# Patient Record
Sex: Female | Born: 1940 | Race: White | Hispanic: No | Marital: Married | Smoking: Never smoker
Health system: Southern US, Community
[De-identification: ages and names within clinical notes are randomized; demographics above are authoritative.]

## PROBLEM LIST (undated history)

## (undated) DIAGNOSIS — M1120 Other chondrocalcinosis, unspecified site: Secondary | ICD-10-CM

## (undated) DIAGNOSIS — M810 Age-related osteoporosis without current pathological fracture: Secondary | ICD-10-CM

## (undated) DIAGNOSIS — K279 Peptic ulcer, site unspecified, unspecified as acute or chronic, without hemorrhage or perforation: Secondary | ICD-10-CM

## (undated) DIAGNOSIS — C801 Malignant (primary) neoplasm, unspecified: Secondary | ICD-10-CM

## (undated) DIAGNOSIS — M5136 Other intervertebral disc degeneration, lumbar region: Secondary | ICD-10-CM

## (undated) DIAGNOSIS — E7800 Pure hypercholesterolemia, unspecified: Secondary | ICD-10-CM

## (undated) DIAGNOSIS — N390 Urinary tract infection, site not specified: Secondary | ICD-10-CM

## (undated) DIAGNOSIS — K219 Gastro-esophageal reflux disease without esophagitis: Secondary | ICD-10-CM

## (undated) DIAGNOSIS — R7989 Other specified abnormal findings of blood chemistry: Secondary | ICD-10-CM

## (undated) DIAGNOSIS — M51369 Other intervertebral disc degeneration, lumbar region without mention of lumbar back pain or lower extremity pain: Secondary | ICD-10-CM

## (undated) DIAGNOSIS — I10 Essential (primary) hypertension: Secondary | ICD-10-CM

## (undated) DIAGNOSIS — K222 Esophageal obstruction: Secondary | ICD-10-CM

## (undated) DIAGNOSIS — N183 Chronic kidney disease, stage 3 unspecified: Secondary | ICD-10-CM

## (undated) DIAGNOSIS — M47816 Spondylosis without myelopathy or radiculopathy, lumbar region: Secondary | ICD-10-CM

## (undated) DIAGNOSIS — M1990 Unspecified osteoarthritis, unspecified site: Secondary | ICD-10-CM

## (undated) HISTORY — PX: UPPER GI ENDOSCOPY: SHX6162

## (undated) HISTORY — PX: ROTATOR CUFF REPAIR: SHX139

## (undated) HISTORY — PX: BASAL CELL CARCINOMA EXCISION: SHX1214

## (undated) HISTORY — PX: KNEE CARTILAGE SURGERY: SHX688

## (undated) HISTORY — PX: COLONOSCOPY: SHX174

## (undated) HISTORY — PX: CERVICAL LAMINECTOMY: SHX94

## (undated) HISTORY — PX: TONSILLECTOMY: SUR1361

## (undated) HISTORY — PX: CARPAL TUNNEL RELEASE: SHX101

## (undated) HISTORY — PX: TOTAL VAGINAL HYSTERECTOMY: SHX2548

## (undated) HISTORY — PX: APPENDECTOMY: SHX54

---

## 2005-01-12 ENCOUNTER — Ambulatory Visit: Payer: Self-pay | Admitting: Obstetrics and Gynecology

## 2006-01-13 ENCOUNTER — Ambulatory Visit: Payer: Self-pay | Admitting: Obstetrics and Gynecology

## 2007-01-17 ENCOUNTER — Ambulatory Visit: Payer: Self-pay | Admitting: Internal Medicine

## 2008-01-19 ENCOUNTER — Ambulatory Visit (HOSPITAL_BASED_OUTPATIENT_CLINIC_OR_DEPARTMENT_OTHER): Admission: RE | Admit: 2008-01-19 | Discharge: 2008-01-19 | Payer: Self-pay | Admitting: Orthopedic Surgery

## 2008-04-04 ENCOUNTER — Ambulatory Visit: Payer: Self-pay | Admitting: Obstetrics and Gynecology

## 2009-02-13 ENCOUNTER — Ambulatory Visit (HOSPITAL_BASED_OUTPATIENT_CLINIC_OR_DEPARTMENT_OTHER): Admission: RE | Admit: 2009-02-13 | Discharge: 2009-02-14 | Payer: Self-pay | Admitting: Orthopedic Surgery

## 2009-08-31 ENCOUNTER — Emergency Department: Payer: Self-pay | Admitting: Emergency Medicine

## 2009-12-30 ENCOUNTER — Ambulatory Visit: Payer: Self-pay | Admitting: Obstetrics and Gynecology

## 2010-06-09 LAB — BASIC METABOLIC PANEL
CO2: 28 mEq/L (ref 19–32)
Chloride: 109 mEq/L (ref 96–112)
Creatinine, Ser: 1.06 mg/dL (ref 0.4–1.2)
GFR calc Af Amer: >60 mL/min (ref >60)

## 2010-07-21 NOTE — Op Note (Signed)
Meghan [name], [name]             ACCOUNT NO.:  [id number]   MEDICAL RECORD NO.:  [id number]          PATIENT TYPE:  AMB   LOCATION:  DSC                          FACILITY:  [facility]   PHYSICIAN:  Katy Fitch. Sypher, M.D. DATE OF BIRTH:  April 10, 1940   DATE OF PROCEDURE:  01/19/2008  DATE OF DISCHARGE:                               OPERATIVE REPORT   PREOPERATIVE DIAGNOSES:  1. Chronic entrapment neuropathy, right median nerve with carpal      tunnel.  2. History of adhesive capsulitis of right shoulder, improving      following steroid injection and therapy.   POSTOPERATIVE DIAGNOSES:  Minimal signs of residual adhesive capsulitis  and right carpal tunnel syndrome.   OPERATION:  1. Release of right transcarpal ligament.  2. Examination of right shoulder under general anesthesia, revealing      combined elevation 180 degrees; external rotation at 90 degrees,      abduction of 95 degrees; internal rotation at 90 degrees, abduction      of 80 degrees; essentially full range of motion.   OPERATIONS:  Katy Fitch. Sypher, MD   ASSISTANT:  Marveen Reeks Dasnoit, PA   ANESTHESIA:  General by LMA.   SUPERVISING ANESTHESIOLOGIST:  Janetta Hora. Gelene Mink, MD   INDICATIONS:  Meghan [name] is a 70 year old woman who was self-  referred for evaluation of hand numbness and shoulder pain.  Clinical  examination revealed signs of carpal tunnel syndrome.  She has been  managed with nonoperative measures including splinting, activity  modification, anti-inflammatory medication, and injection.   Due to a persistence of her hand numbness, she now presents for release  of her right transcarpal ligament.  In addition to her hand numbness,  she has had a history of shoulder discomfort, loss of motion, and pain.  She was treated with a steroid injection into her shoulder and  objectively increased her range of motion.  She reported persistent  shoulder pain.  We advised that we would examine her shoulder  under  anesthesia while performing her carpal tunnel release so as to determine  whether or not she had residual adhesive capsulitis.   There may be another explanation for her shoulder pain, such as  degenerative arthritis or tendinopathy.   After informed consent, she was brought to the operating room at this  time anticipating right carpal tunnel release through a very limited  incision with careful inspection of the contents of the carpal canal  including the median nerve, flexor tendons, and ulnar bursa and  examination of the right shoulder under anesthesia.   PROCEDURE:  Meghan [name] was brought to the operating room and placed  in supine position upon the operating table.   Following the induction of general anesthesia, we carefully examined Ms.  [name]' shoulder.  Lying in the supine position, I could combined  elevate the arm to 180 degrees and noted at 90 degrees abduction  external rotation of 95 degrees.  With the scapula stabilized, internal  rotation of 80 degrees was documented.   Given these findings and clinical examination, it would appear on an  objective basis that  her adhesive capsulitis symptoms had resolved.  There is likely an alternative explanation for her persistent shoulder  discomfort including possible degenerative arthritis or perhaps  tendinopathy.   We will continue to proceed down the diagnostic pathway to sort out her  shoulder discomfort issues.  To date, she has had not had an MRI.   The right arm was prepped with Betadine soap solution and sterilely  draped.  A pneumatic tourniquet was applied to the proximal right  brachium.  Upon exsanguination of the right arm with an Esmarch bandage,  the arterial tourniquet was inflated to 250 mmHg due to mild systolic  hypertension noted at induction.  The procedure commenced with a 0.5-  inch incision in the line of the ring finger and the palm.  Subcutaneous  tissues were carefully divided taking  care to identify the palmar  fascia.  The fascia was split longitudinally.  Traversing vessels were  electrocauterized with bipolar forceps.  A [place] 4 elevator was used  to sound the carpal canal.  The ulnar bursa was cleared from the deep  surface of the transcarpal ligament along its ulnar border followed by  release with the transcarpal ligament scissors extending into the distal  forearm.   This widely opened the carpal canal.  No masses or other predicaments  were noted.   Bleeding points along the margin of the released ligament were  electrocauterized with bipolar current followed by repair of the skin  with intradermal 3-0 Prolene suture.   A compressive dressing was applied with a volar plaster splint  maintaining the wrist in 5 degrees of dorsiflexion.   There were no apparent complications.   Meghan [name]' tolerated the surgery and anesthesia well.  She was  transferred to the recovery room with stable signs.      Katy Fitch Sypher, M.D.  Electronically Signed     RVS/MEDQ  D:  01/19/2008  T:  01/19/2008  Job:  [id number]

## 2010-12-08 LAB — BASIC METABOLIC PANEL
CO2: 31
Chloride: 105
Creatinine, Ser: 1.03
GFR calc Af Amer: >60
Sodium: 140

## 2011-01-15 ENCOUNTER — Ambulatory Visit: Payer: Self-pay | Admitting: Obstetrics and Gynecology

## 2011-08-19 ENCOUNTER — Other Ambulatory Visit: Payer: Self-pay | Admitting: Podiatry

## 2011-08-19 LAB — SYNOVIAL CELL COUNT + DIFF, W/ CRYSTALS
Basophil: 0 %
Eosinophil: 0 %
Lymphocytes: 0 %
Neutrophils: 93 %
Nucleated Cell Count: 6090 /mm3
Other Cells BF: 0 %
Other Mononuclear Cells: 7 %

## 2012-02-28 ENCOUNTER — Ambulatory Visit: Payer: Self-pay | Admitting: Obstetrics and Gynecology

## 2013-03-30 ENCOUNTER — Ambulatory Visit: Payer: Self-pay | Admitting: Obstetrics and Gynecology

## 2013-06-26 ENCOUNTER — Other Ambulatory Visit: Payer: Self-pay | Admitting: Physical Medicine and Rehabilitation

## 2013-06-26 DIAGNOSIS — M545 Low back pain, unspecified: Secondary | ICD-10-CM

## 2013-06-26 DIAGNOSIS — M79605 Pain in left leg: Secondary | ICD-10-CM

## 2013-07-03 ENCOUNTER — Ambulatory Visit
Admission: RE | Admit: 2013-07-03 | Discharge: 2013-07-03 | Disposition: A | Discharge status: Home / Self Care | Payer: Medicare Other | Source: Ambulatory Visit | Attending: Physical Medicine and Rehabilitation | Admitting: Physical Medicine and Rehabilitation

## 2013-07-03 DIAGNOSIS — M545 Low back pain, unspecified: Secondary | ICD-10-CM

## 2013-07-03 DIAGNOSIS — M79605 Pain in left leg: Secondary | ICD-10-CM

## 2014-04-03 ENCOUNTER — Ambulatory Visit: Payer: Self-pay | Admitting: Internal Medicine

## 2017-03-11 ENCOUNTER — Other Ambulatory Visit: Payer: Self-pay | Admitting: Internal Medicine

## 2017-03-11 DIAGNOSIS — Z1231 Encounter for screening mammogram for malignant neoplasm of breast: Secondary | ICD-10-CM

## 2017-03-30 ENCOUNTER — Ambulatory Visit
Admission: RE | Admit: 2017-03-30 | Discharge: 2017-03-30 | Disposition: A | Discharge status: Home / Self Care | Payer: Medicare Other | Source: Ambulatory Visit | Attending: Internal Medicine | Admitting: Internal Medicine

## 2017-03-30 ENCOUNTER — Encounter (HOSPITAL_COMMUNITY): Payer: Self-pay

## 2017-03-30 DIAGNOSIS — Z1231 Encounter for screening mammogram for malignant neoplasm of breast: Secondary | ICD-10-CM | POA: Insufficient documentation

## 2017-03-30 HISTORY — DX: Malignant (primary) neoplasm, unspecified: C80.1

## 2018-04-17 ENCOUNTER — Other Ambulatory Visit: Payer: Self-pay | Admitting: Internal Medicine

## 2018-04-17 DIAGNOSIS — Z1231 Encounter for screening mammogram for malignant neoplasm of breast: Secondary | ICD-10-CM

## 2018-04-27 ENCOUNTER — Ambulatory Visit
Admission: RE | Admit: 2018-04-27 | Discharge: 2018-04-27 | Disposition: A | Discharge status: Home / Self Care | Payer: Medicare Other | Source: Ambulatory Visit | Attending: Internal Medicine | Admitting: Internal Medicine

## 2018-04-27 DIAGNOSIS — Z1231 Encounter for screening mammogram for malignant neoplasm of breast: Secondary | ICD-10-CM | POA: Diagnosis not present

## 2019-04-05 ENCOUNTER — Other Ambulatory Visit: Payer: Self-pay | Admitting: Internal Medicine

## 2019-04-05 DIAGNOSIS — Z1231 Encounter for screening mammogram for malignant neoplasm of breast: Secondary | ICD-10-CM

## 2019-05-04 ENCOUNTER — Ambulatory Visit
Admission: RE | Admit: 2019-05-04 | Discharge: 2019-05-04 | Disposition: A | Discharge status: Home / Self Care | Payer: Medicare Other | Source: Ambulatory Visit | Attending: Internal Medicine | Admitting: Internal Medicine

## 2019-05-04 DIAGNOSIS — Z1231 Encounter for screening mammogram for malignant neoplasm of breast: Secondary | ICD-10-CM | POA: Diagnosis present

## 2019-05-08 ENCOUNTER — Other Ambulatory Visit: Payer: Self-pay | Admitting: Internal Medicine

## 2019-05-08 DIAGNOSIS — R928 Other abnormal and inconclusive findings on diagnostic imaging of breast: Secondary | ICD-10-CM

## 2019-05-08 DIAGNOSIS — N6310 Unspecified lump in the right breast, unspecified quadrant: Secondary | ICD-10-CM

## 2019-05-16 ENCOUNTER — Ambulatory Visit
Admission: RE | Admit: 2019-05-16 | Discharge: 2019-05-16 | Disposition: A | Discharge status: Home / Self Care | Payer: Medicare Other | Source: Ambulatory Visit | Attending: Internal Medicine | Admitting: Internal Medicine

## 2019-05-16 DIAGNOSIS — R928 Other abnormal and inconclusive findings on diagnostic imaging of breast: Secondary | ICD-10-CM

## 2019-05-16 DIAGNOSIS — N6310 Unspecified lump in the right breast, unspecified quadrant: Secondary | ICD-10-CM | POA: Diagnosis present

## 2019-05-18 ENCOUNTER — Other Ambulatory Visit: Payer: Self-pay | Admitting: Internal Medicine

## 2019-05-18 DIAGNOSIS — N6310 Unspecified lump in the right breast, unspecified quadrant: Secondary | ICD-10-CM

## 2019-11-21 ENCOUNTER — Ambulatory Visit
Admission: RE | Admit: 2019-11-21 | Discharge: 2019-11-21 | Disposition: A | Discharge status: Home / Self Care | Payer: Medicare Other | Source: Ambulatory Visit | Attending: Internal Medicine | Admitting: Internal Medicine

## 2019-11-21 ENCOUNTER — Other Ambulatory Visit: Payer: Self-pay

## 2019-11-21 ENCOUNTER — Other Ambulatory Visit: Payer: Self-pay | Admitting: Internal Medicine

## 2019-11-21 DIAGNOSIS — N6310 Unspecified lump in the right breast, unspecified quadrant: Secondary | ICD-10-CM | POA: Diagnosis present

## 2019-11-21 DIAGNOSIS — R928 Other abnormal and inconclusive findings on diagnostic imaging of breast: Secondary | ICD-10-CM

## 2019-11-22 ENCOUNTER — Other Ambulatory Visit: Payer: Self-pay | Admitting: Internal Medicine

## 2019-11-22 DIAGNOSIS — N6310 Unspecified lump in the right breast, unspecified quadrant: Secondary | ICD-10-CM

## 2020-11-21 DIAGNOSIS — Z85828 Personal history of other malignant neoplasm of skin: Secondary | ICD-10-CM | POA: Diagnosis not present

## 2020-11-21 DIAGNOSIS — R519 Headache, unspecified: Secondary | ICD-10-CM | POA: Diagnosis not present

## 2020-11-21 DIAGNOSIS — R197 Diarrhea, unspecified: Secondary | ICD-10-CM | POA: Insufficient documentation

## 2020-11-21 DIAGNOSIS — R7309 Other abnormal glucose: Secondary | ICD-10-CM | POA: Diagnosis not present

## 2020-11-21 DIAGNOSIS — R42 Dizziness and giddiness: Secondary | ICD-10-CM | POA: Diagnosis present

## 2020-11-21 DIAGNOSIS — E860 Dehydration: Secondary | ICD-10-CM | POA: Diagnosis not present

## 2020-11-21 DIAGNOSIS — R112 Nausea with vomiting, unspecified: Secondary | ICD-10-CM | POA: Insufficient documentation

## 2020-11-21 LAB — COMPREHENSIVE METABOLIC PANEL
ALT: 20 U/L (ref 0–44)
AST: 26 U/L (ref 15–41)
Albumin: 4.2 g/dL (ref 3.5–5.0)
Alkaline Phosphatase: 58 U/L (ref 38–126)
Anion gap: 8 (ref 5–15)
BUN: 15 mg/dL (ref 8–23)
CO2: 27 mmol/L (ref 22–32)
Calcium: 8.8 mg/dL — ABNORMAL LOW (ref 8.9–10.3)
Chloride: 103 mmol/L (ref 98–111)
Creatinine, Ser: 0.9 mg/dL (ref 0.44–1.00)
GFR, Estimated: >60 mL/min (ref >60)
Glucose, Bld: 143 mg/dL — ABNORMAL HIGH (ref 70–99)
Potassium: 4.4 mmol/L (ref 3.5–5.1)
Sodium: 138 mmol/L (ref 135–145)
Total Bilirubin: 0.9 mg/dL (ref 0.3–1.2)
Total Protein: 6.9 g/dL (ref 6.5–8.1)

## 2020-11-21 LAB — CBG MONITORING, ED: Glucose-Capillary: 130 mg/dL — ABNORMAL HIGH (ref 70–99)

## 2020-11-21 LAB — CBC
HCT: 42.5 % (ref 36.0–46.0)
Hemoglobin: 14.3 g/dL (ref 12.0–15.0)
MCH: 30.9 pg (ref 26.0–34.0)
MCHC: 33.6 g/dL (ref 30.0–36.0)
MCV: 91.8 fL (ref 80.0–100.0)
Platelets: 197 10*3/uL (ref 150–400)
RBC: 4.63 MIL/uL (ref 3.87–5.11)
RDW: 12.3 % (ref 11.5–15.5)
WBC: 6.5 10*3/uL (ref 4.0–10.5)
nRBC: 0 % (ref 0.0–0.2)

## 2020-11-21 LAB — LIPASE, BLOOD: Lipase: 39 U/L (ref 11–51)

## 2020-11-21 NOTE — ED Triage Notes (Signed)
Pt c/o dizziness and diarrhea starting last night and emesis and upper abdominal pain starting this morning.  Pain score 5/10.  Pt reports she has not been able to keep anything down.  Sts "it feels like the world is spinning."  Facial symmetry and equal grips/strength noted.

## 2020-11-21 NOTE — ED Triage Notes (Signed)
Arrives from [facility] for evaluation of blurred vision and HTN.  Per [place] report, symptom onset last evening at around 1800.  Patient is AAOx3.  Skin warm and dry.  MAE equal and strong. NAD

## 2020-11-22 ENCOUNTER — Emergency Department: Payer: Medicare Other

## 2020-11-22 ENCOUNTER — Emergency Department
Admission: EM | Admit: 2020-11-22 | Discharge: 2020-11-22 | Disposition: A | Discharge status: Home / Self Care | Payer: Medicare Other | Attending: Emergency Medicine | Admitting: Emergency Medicine

## 2020-11-22 ENCOUNTER — Other Ambulatory Visit: Payer: Self-pay

## 2020-11-22 DIAGNOSIS — R112 Nausea with vomiting, unspecified: Secondary | ICD-10-CM

## 2020-11-22 DIAGNOSIS — E860 Dehydration: Secondary | ICD-10-CM | POA: Diagnosis not present

## 2020-11-22 DIAGNOSIS — R519 Headache, unspecified: Secondary | ICD-10-CM

## 2020-11-22 LAB — MAGNESIUM: Magnesium: 2.3 mg/dL (ref 1.7–2.4)

## 2020-11-22 LAB — URINALYSIS, COMPLETE (UACMP) WITH MICROSCOPIC
Bilirubin Urine: NEGATIVE
Glucose, UA: NEGATIVE mg/dL
Ketones, ur: 40 mg/dL — AB
Nitrite: NEGATIVE
Protein, ur: NEGATIVE mg/dL
Specific Gravity, Urine: 1.015 (ref 1.005–1.030)
Squamous Epithelial / HPF: NONE SEEN (ref 0–5)
pH: 7 (ref 5.0–8.0)

## 2020-11-22 MED ORDER — PROCHLORPERAZINE EDISYLATE 10 MG/2ML IJ SOLN
10.0000 mg | Freq: Once | INTRAMUSCULAR | Status: AC
  Administered 2020-11-22: 10 mg via INTRAVENOUS
  Filled 2020-11-22: qty 2

## 2020-11-22 MED ORDER — IOHEXOL 350 MG/ML SOLN
75.0000 mL | Freq: Once | INTRAVENOUS | Status: AC | PRN
  Administered 2020-11-22: 75 mL via INTRAVENOUS
  Filled 2020-11-22: qty 75

## 2020-11-22 MED ORDER — MECLIZINE HCL 25 MG PO CHEW: 1.0000 | CHEWABLE_TABLET | Freq: Two times a day (BID) | ORAL | 0 refills | Status: AC | PRN

## 2020-11-22 MED ORDER — LACTATED RINGERS IV BOLUS
1000.0000 mL | Freq: Once | INTRAVENOUS | Status: AC
  Administered 2020-11-22: 1000 mL via INTRAVENOUS

## 2020-11-22 NOTE — ED Provider Notes (Signed)
[facility] Emergency Department Provider Note  ____________________________________________   Event Date/Time   First MD Initiated Contact with Patient 11/22/20 1111     (approximate)  I have reviewed the triage vital signs and the nursing notes.   HISTORY  Chief Complaint Dizziness, Diarrhea, and Emesis   HPI Meghan [name] is a 80 y.o. female with a past medical history of hypertension and osteoporosis who presents for assessment of headache, vertigo, and some nonbloody nonbilious vomiting as well as some nonbloody diarrhea and crampy abdominal pain seems to have been particularly severe yesterday.  Patient states her abdominal pain is now gone and she has not had any vomiting today although she attributes this to her stomach being completely empty.  She states she still has some headache and feels a little bit dizzy.  She states the headache radiates towards the back of her head and down the right side of her neck.  She denies any recent trauma.  No photophobia or phonophobia.  No prior similar headaches.  She notes her blood pressures been very high and she initially gone to urgent care and was told the top number was over 200 change in emergency room.  She denies any vision changes, chest pain, cough, shortness of breath, back pain, urinary symptoms, rash or extremity pain.  No focal weakness numbness or tingling.  No history of stroke or cardiac history.  No recent travel or sick contacts.  No suspicious food intake.  No other acute concerns at this time.     Past Medical History:  Diagnosis Date   Cancer ([place])    skin    There are no problems to display for this patient.   History reviewed. No pertinent surgical history.  Prior to Admission medications   Medication Sig Start Date End Date Taking? Authorizing Provider  Meclizine HCl (ANTIVERT) 25 MG CHEW Chew 1 tablet (25 mg total) by mouth 2 (two) times daily as needed for up to 3 days. 11/22/20  11/25/20 Yes Lucrezia Starch, MD    Allergies Macrobid [nitrofurantoin], Percocet [oxycodone-acetaminophen], and Morphine and related  Family History  Problem Relation Age of Onset   Breast cancer Neg Hx     Social History Social History   Tobacco Use   Smoking status: Never   Smokeless tobacco: Never  Substance Use Topics   Alcohol use: Never   Drug use: Never    Review of Systems  Review of Systems  Constitutional:  Positive for malaise/fatigue. Negative for chills and fever.  HENT:  Negative for sore throat.   Eyes:  Negative for pain.  Respiratory:  Negative for cough and stridor.   Cardiovascular:  Negative for chest pain.  Gastrointestinal:  Positive for abdominal pain, diarrhea, nausea and vomiting.  Genitourinary:  Negative for dysuria.  Musculoskeletal:  Negative for myalgias.  Skin:  Negative for rash.  Neurological:  Positive for dizziness and headaches. Negative for seizures and loss of consciousness.  Psychiatric/Behavioral:  Negative for suicidal ideas.   All other systems reviewed and are negative.    ____________________________________________   PHYSICAL EXAM:  VITAL SIGNS: ED Triage Vitals  Enc Vitals Group     BP 11/21/20 1900 (!) 163/94     Pulse Rate 11/21/20 1900 69     Resp 11/21/20 1900 16     Temp 11/21/20 1900 98.2 F (36.8 C)     Temp Source 11/21/20 1900 Oral     SpO2 11/21/20 1900 100 %  Weight 11/21/20 1901 130 lb (59 kg)     Height 11/21/20 1901 '5\' 3"'$  (1.6 m)     Head Circumference --      Peak Flow --      Pain Score 11/21/20 1901 5     Pain Loc --      Pain Edu? --      Excl. in [place]? --    Vitals:   11/22/20 0834 11/22/20 1242  BP: (!) 198/71 (!) 146/70  Pulse: (!) 57 64  Resp: 18 16  Temp:    SpO2: 100% 99%   Physical Exam Vitals and nursing note reviewed.  Constitutional:      General: She is not in acute distress.    Appearance: She is well-developed.  HENT:     Head: Normocephalic and atraumatic.      Right Ear: External ear normal.     Left Ear: External ear normal.     Nose: Nose normal.     Mouth/Throat:     Mouth: Mucous membranes are dry.  Eyes:     Conjunctiva/sclera: Conjunctivae normal.  Cardiovascular:     Rate and Rhythm: Normal rate and regular rhythm.     Heart sounds: No murmur heard. Pulmonary:     Effort: Pulmonary effort is normal. No respiratory distress.     Breath sounds: Normal breath sounds.  Abdominal:     Palpations: Abdomen is soft.     Tenderness: There is no abdominal tenderness.  Musculoskeletal:     Cervical back: Neck supple.  Skin:    General: Skin is warm and dry.     Capillary Refill: Capillary refill takes more than 3 seconds.  Neurological:     Mental Status: She is alert and oriented to person, place, and time.    Cranial nerves II through XII grossly intact.  No pronator drift.  No finger dysmetria.  Symmetric 5/5 strength of all extremities.  Sensation intact to light touch in all extremities.  Unremarkable unassisted gait.  ____________________________________________   LABS (all labs ordered are listed, but only abnormal results are displayed)  Labs Reviewed  COMPREHENSIVE METABOLIC PANEL - Abnormal; Notable for the following components:      Result Value   Glucose, Bld 143 (*)    Calcium 8.8 (*)    All other components within normal limits  URINALYSIS, COMPLETE (UACMP) WITH MICROSCOPIC - Abnormal; Notable for the following components:   Hgb urine dipstick SMALL (*)    Ketones, ur 40 (*)    Leukocytes,Ua TRACE (*)    Bacteria, UA RARE (*)    All other components within normal limits  CBG MONITORING, ED - Abnormal; Notable for the following components:   Glucose-Capillary 130 (*)    All other components within normal limits  CBC  LIPASE, BLOOD  MAGNESIUM  CBG MONITORING, ED   ____________________________________________  EKG  ECG shows sinus rhythm with ventricular rate of 63, normal axis, unremarkable intervals without  evidence of acute ischemia or significant arrhythmia. ____________________________________________  RADIOLOGY  ED MD interpretation: CT without contrast of the head shows no evidence of SAH or acute intracranial pathology.  There is evidence of cerebral atrophy.  CTA head and neck shows no evidence of significant stenosis, occlusion dissection or aneurysm.  No evidence of myositis or subcutaneous abscess.  Official radiology report(s): CT ANGIO HEAD NECK W WO CM  Result Date: 11/22/2020 CLINICAL DATA:  Dizziness, blurred vision EXAM: CT ANGIOGRAPHY HEAD AND NECK TECHNIQUE: Multidetector CT imaging of the  head and neck was performed using the standard protocol during bolus administration of intravenous contrast. Multiplanar CT image reconstructions and MIPs were obtained to evaluate the vascular anatomy. Carotid stenosis measurements (when applicable) are obtained utilizing NASCET criteria, using the distal internal carotid diameter as the denominator. CONTRAST:  60m OMNIPAQUE IOHEXOL 350 MG/ML SOLN COMPARISON:  Same-day noncontrast CT head FINDINGS: CTA NECK FINDINGS Aortic arch: There is mild calcified atherosclerotic plaque of the aortic arch. The aortic arch is otherwise unremarkable. Right carotid system: No evidence of dissection, stenosis (50% or greater) or occlusion. Left carotid system: No evidence of dissection, stenosis (50% or greater) or occlusion. Vertebral arteries: There is mild calcified atherosclerotic plaque at the origin of the right vertebral artery without hemodynamically significant stenosis or occlusion. The vertebral arteries are otherwise patent, without significant stenosis, occlusion, or dissection. Skeleton: There is multilevel degenerative change of the cervical spine, most advanced at C4-C5 through C6-C7. There is grade 1 anterolisthesis of C3 on C4. There is no acute osseous abnormality or aggressive osseous lesion. Other neck: The thyroid is mildly heterogeneous with no  suspicious nodules. The soft tissues are otherwise unremarkable. Upper chest: There is mild central bronchial wall thickening. There is mild biapical parenchymal scarring. Review of the MIP images confirms the above findings CTA HEAD FINDINGS Anterior circulation: There is mild calcification of the bilateral cavernous ICAs without hemodynamically significant stenosis or occlusion. The bilateral MCAs and ACAs are patent. There is no aneurysm. Posterior circulation: The V4 segments of the vertebral arteries are patent. The basilar artery is patent. The bilateral PCAs are patent. The posterior communicating arteries are identified bilaterally. Venous sinuses: Patent. Anatomic variants: None. Review of the MIP images confirms the above findings IMPRESSION: Patent vasculature of the head and neck with no significant stenosis, occlusion, dissection, or aneurysm. Electronically Signed   By: PValetta MoleM.D.   On: 11/22/2020 14:05   CT HEAD WO CONTRAST (5MM)  Result Date: 11/22/2020 CLINICAL DATA:  Dizziness. EXAM: CT HEAD WITHOUT CONTRAST TECHNIQUE: Contiguous axial images were obtained from the base of the skull through the vertex without intravenous contrast. COMPARISON:  None. FINDINGS: Brain: There is mild cerebral atrophy with widening of the extra-axial spaces and ventricular dilatation. There are areas of decreased attenuation within the white matter tracts of the supratentorial brain, consistent with microvascular disease changes. Vascular: No hyperdense vessel or unexpected calcification. Skull: Normal. Negative for fracture or focal lesion. Sinuses/Orbits: No acute finding. Other: None. IMPRESSION: 1. Generalized cerebral atrophy. 2. No acute intracranial abnormality. Electronically Signed   By: TVirgina NorfolkM.D.   On: 11/22/2020 04:21    ____________________________________________   PROCEDURES  Procedure(s) performed (including Critical  Care):  Procedures   ____________________________________________   INITIAL IMPRESSION / ASSESSMENT AND PLAN / ED COURSE      Patient presents with above-stated history exam for assessment of some nausea, vomiting and abdominal pain that has since resolved associate with headache and some dizziness described as vertigo.  On arrival patient is hypertensive with BP of 163/94.  She states is not any significant vomiting or diarrhea today.  She is feeling empty.  She states her abdominal pain is resolved.  She has a nonfocal neuro exam.  I suspect likely some dehydration secondary to acute infectious gastroenteritis likely causing blood pressure to be elevated as patient has been unable to tolerate any of her metoprolol over the last 24 hours .  There were no focal deficits or ataxia on exam to suggest central cause for  her vertigo.  CT head shows no evidence of intracranial hemorrhage or other clear acute intracranial process.  Given she did describes a throbbing onset of her neck CTA obtained to rule out dissection is unremarkable.  Absence of any chest pain and reassuring EKG is not suggestive of atypical presentation for ACS.   CMP shows no significant electrolyte or metabolic derangements.  CBC shows no leukocytosis or acute anemia.  Lipase not consistent with acute pancreatitis.  Magnesium is within normal limits.  UA has some rare bacteria but no elevation of WBCs, or nitrites and only trace leukocyte esterase and given absence of any complaints of dysuria I have a low suspicion for clinically significant cystitis at this time.  Patient given some IV fluids for dehydration and Compazine for headache and nausea.  On reassessment she was able to tolerate some ginger ale and eat some crackers.  She states she is feeling better.  Her blood pressure briefly up trended to 190s although is now back down to 146/70 at 2 PM.  Will defer further antihypertensives at this time.  She states her headache and  dizziness have both much improved.  She was able to ambulate with steady gait unassisted to the bathroom and back.  Given improvement in symptoms with stable vitals and reassuring exam and head imaging and labs I think she is stable for discharge with close outpatient follow-up.  Advised her she may resume her metoprolol.  Will prescribe short course of Antivert for what I suspect is likely peripheral vertigo.  Discharged stable condition.  Strict return precautions advised and discussed.          ____________________________________________   FINAL CLINICAL IMPRESSION(S) / ED DIAGNOSES  Final diagnoses:  Dehydration  Nonintractable headache, unspecified chronicity pattern, unspecified headache type  Nausea vomiting and diarrhea    Medications  lactated ringers bolus 1,000 mL (1,000 mLs Intravenous New Bag/Given 11/22/20 1405)  prochlorperazine (COMPAZINE) injection 10 mg (10 mg Intravenous Given 11/22/20 1405)  iohexol (OMNIPAQUE) 350 MG/ML injection 75 mL (75 mLs Intravenous Contrast Given 11/22/20 1330)     ED Discharge Orders          Ordered    Meclizine HCl (ANTIVERT) 25 MG CHEW  2 times daily PRN        11/22/20 1518             Note:  This document was prepared using Dragon voice recognition software and may include unintentional dictation errors.    Lucrezia Starch, MD 11/22/20 [phone]

## 2021-04-22 ENCOUNTER — Other Ambulatory Visit: Payer: Self-pay | Admitting: Internal Medicine

## 2021-04-22 DIAGNOSIS — Z1231 Encounter for screening mammogram for malignant neoplasm of breast: Secondary | ICD-10-CM

## 2021-05-27 ENCOUNTER — Other Ambulatory Visit: Payer: Self-pay

## 2021-05-27 ENCOUNTER — Ambulatory Visit
Admission: RE | Admit: 2021-05-27 | Discharge: 2021-05-27 | Disposition: A | Discharge status: Home / Self Care | Payer: Medicare Other | Source: Ambulatory Visit | Attending: Internal Medicine | Admitting: Internal Medicine

## 2021-05-27 DIAGNOSIS — Z1231 Encounter for screening mammogram for malignant neoplasm of breast: Secondary | ICD-10-CM | POA: Insufficient documentation

## 2021-11-18 ENCOUNTER — Other Ambulatory Visit: Payer: Self-pay | Admitting: Orthopedic Surgery

## 2021-11-18 DIAGNOSIS — Z01818 Encounter for other preprocedural examination: Secondary | ICD-10-CM

## 2021-11-23 ENCOUNTER — Ambulatory Visit
Admission: RE | Admit: 2021-11-23 | Discharge: 2021-11-23 | Disposition: A | Discharge status: Home / Self Care | Payer: Medicare Other | Source: Ambulatory Visit | Attending: Orthopedic Surgery | Admitting: Orthopedic Surgery

## 2021-11-23 DIAGNOSIS — Z01818 Encounter for other preprocedural examination: Secondary | ICD-10-CM

## 2021-12-16 NOTE — Progress Notes (Signed)
Sent message, via epic in basket, requesting orders in epic from surgeon.  

## 2021-12-22 ENCOUNTER — Encounter (HOSPITAL_COMMUNITY): Payer: Self-pay | Admitting: Orthopedic Surgery

## 2021-12-22 NOTE — Progress Notes (Signed)
For Short Stay: [place] appointment date: Date of COVID positive in last 52 days:  Bowel Prep reminder:   For Anesthesia: PCP - Kirk Ruths, MD last office visit note 07/29/21 Cardiologist -   Chest x-ray -  EKG -  Stress Test -  ECHO -  Cardiac Cath -  Pacemaker/ICD device last checked: Pacemaker orders received: Device Rep notified:  Spinal Cord Stimulator:  Sleep Study -  CPAP -   Fasting Blood Sugar -  Checks Blood Sugar _____ times a day Date and result of last Hgb A1c-  Blood Thinner Instructions: Aspirin Instructions: Last Dose:  Activity level: Can go up a flight of stairs and activities of daily living without stopping and without chest pain and/or shortness of breath   Able to exercise without chest pain and/or shortness of breath   Unable to go up a flight of stairs without chest pain and/or shortness of breath     Anesthesia review:   Patient denies shortness of breath, fever, cough and chest pain at PAT appointment   Patient verbalized understanding of instructions that were given to them at the PAT appointment. Patient was also instructed that they will need to review over the PAT instructions again at home before surgery.

## 2021-12-22 NOTE — Patient Instructions (Addendum)
SURGICAL WAITING ROOM VISITATION Patients having surgery or a procedure may have no more than 2 support people in the waiting area - these visitors may rotate.   Children under the age of 41 must have an adult with them who is not the patient. If the patient needs to stay at the hospital during part of their recovery, the visitor guidelines for inpatient rooms apply. Pre-op nurse will coordinate an appropriate time for 1 support person to accompany patient in pre-op.  This support person may not rotate.    Please refer to the [facility] website for the visitor guidelines for Inpatients (after your surgery is over and you are in a regular room).       Your procedure is scheduled on: Tuesday, Oct. 31, 2023   Report to [facility] Main Entrance    Report to admitting at 5:30 AM   Call this number if you have problems the morning of surgery [phone]   Do not eat food :After Midnight.   After Midnight you may have the following liquids until 4:30  AM DAY OF SURGERY  Water Non-Citrus Juices (without pulp, NO RED) Carbonated Beverages Black Coffee (NO MILK/CREAM OR CREAMERS, sugar ok)  Clear Tea (NO MILK/CREAM OR CREAMERS, sugar ok) regular and decaf                             Plain Jell-O (NO RED)                                           Fruit ices (not with fruit pulp, NO RED)                                     Popsicles (NO RED)                                                               Sports drinks like Gatorade (NO RED)              Drink 2 Ensure/G2 drinks AT 10:00 PM the night before surgery.        The day of surgery:  Drink ONE (1) Pre-Surgery Clear Ensure at 4:30 AM the morning of surgery. Drink in one sitting. Do not sip.  This drink was given to you during your hospital  pre-op appointment visit. Nothing else to drink after completing the  Pre-Surgery Clear Ensure.          If you have questions, please contact your surgeon's office.   FOLLOW  BOWEL PREP AND ANY ADDITIONAL PRE OP INSTRUCTIONS YOU RECEIVED FROM YOUR SURGEON'S OFFICE!!!     Oral Hygiene is also important to reduce your risk of infection.                                    Remember - BRUSH YOUR TEETH THE MORNING OF SURGERY WITH YOUR REGULAR TOOTHPASTE   Do NOT smoke after Midnight   Take these medicines  the morning of surgery with A SIP OF WATER: Carvedilol, Colchicine                              You may not have any metal on your body including hair pins, jewelry, and body piercing             Do not wear make-up, lotions, powders, perfumes/cologne, or deodorant  Do not wear nail polish including gel and S&S, artificial/acrylic nails, or any other type of covering on natural nails including finger and toenails. If you have artificial nails, gel coating, etc. that needs to be removed by a nail salon please have this removed prior to surgery or surgery may need to be canceled/ delayed if the surgeon/ anesthesia feels like they are unable to be safely monitored.   Do not shave  48 hours prior to surgery.    Do not bring valuables to the hospital. [place].   Contacts, dentures or bridgework may not be worn into surgery.  DO NOT [place]. PHARMACY WILL DISPENSE MEDICATIONS LISTED ON YOUR MEDICATION LIST TO YOU DURING YOUR ADMISSION [place]!    Patients discharged on the day of surgery will not be allowed to drive home.  Someone NEEDS to stay with you for the first 24 hours after anesthesia.   Special Instructions: Bring a copy of your healthcare power of attorney and living will documents the day of surgery if you haven't scanned them before.              Please read over the following fact sheets you were given: IF [place] [phone]   If you received a COVID test during your pre-op visit  it is requested that you  wear a mask when out in public, stay away from anyone that may not be feeling well and notify your surgeon if you develop symptoms. If you test positive for Covid or have been in contact with anyone that has tested positive in the last 10 days please notify you surgeon.     [place]- Preparing for Total Shoulder Arthroplasty    Before surgery, you can play an important role. Because skin is not sterile, your skin needs to be as free of germs as possible. You can reduce the number of germs on your skin by using the following products. Benzoyl Peroxide Gel Reduces the number of germs present on the skin Applied twice a day to shoulder area starting two days before surgery    ==================================================================  Please follow these instructions carefully:  BENZOYL PEROXIDE 5% GEL  Please do not use if you have an allergy to benzoyl peroxide.   If your skin becomes reddened/irritated stop using the benzoyl peroxide.  Starting two days before surgery, apply as follows:  Sunday, Oct. 29, 2023 and Monday, Oct. 30, 2023 Apply benzoyl peroxide in the morning and at night. Apply after taking a shower. If you are not taking a shower clean entire shoulder front, back, and side along with the armpit with a clean wet washcloth.  Place a quarter-sized dollop on your shoulder and rub in thoroughly, making sure to cover the front, back, and side of your shoulder, along with the armpit.   2 days before ____ AM  ____ PM              1 day before ____ AM   ____ PM                         Do this twice a day for two days.  (Last application is the night before surgery, AFTER using the CHG soap as described below).  Do NOT apply benzoyl peroxide gel on the day of surgery.   [place] - Preparing for Surgery Before surgery, you can play an important role.  Because skin is not sterile, your skin needs to be as free of germs as possible.  You can reduce the number of germs on  your skin by washing with CHG (chlorahexidine gluconate) soap before surgery.  CHG is an antiseptic cleaner which kills germs and bonds with the skin to continue killing germs even after washing. Please DO NOT use if you have an allergy to CHG or antibacterial soaps.  If your skin becomes reddened/irritated stop using the CHG and inform your nurse when you arrive at Short Stay. Do not shave (including legs and underarms) for at least 48 hours prior to the first CHG shower.  You may shave your face/neck.  Please follow these instructions carefully:  1.  Shower with CHG Soap the night before surgery and the  morning of surgery.  2.  If you choose to wash your hair, wash your hair first as usual with your normal  shampoo.  3.  After you shampoo, rinse your hair and body thoroughly to remove the shampoo.                             4.  Use CHG as you would any other liquid soap.  You can apply chg directly to the skin and wash.  Gently with a scrungie or clean washcloth.  5.  Apply the CHG Soap to your body ONLY FROM THE NECK DOWN.   Do   not use on face/ open                           Wound or open sores. Avoid contact with eyes, ears mouth and   genitals (private parts).                       Wash face,  Genitals (private parts) with your normal soap.             6.  Wash thoroughly, paying special attention to the area where your    surgery  will be performed.  7.  Thoroughly rinse your body with warm water from the neck down.  8.  DO NOT shower/wash with your normal soap after using and rinsing off the CHG Soap.                9.  Pat yourself dry with a clean towel.            10.  Wear clean pajamas.            11.  Place clean sheets on your bed the night of your first shower and do not  sleep with pets. Day of Surgery : Do not apply any lotions/deodorants the morning of surgery.  Please wear clean clothes to the hospital/surgery center.  FAILURE TO FOLLOW THESE INSTRUCTIONS MAY RESULT  IN THE  CANCELLATION OF YOUR SURGERY  PATIENT SIGNATURE_________________________________  NURSE SIGNATURE__________________________________  ________________________________________________________________________   Adam Phenix (Watch this video at home: [email or web address])  An incentive spirometer is a tool that can help keep your lungs clear and active. This tool measures how well you are filling your lungs with each breath. Taking long deep breaths may help reverse or decrease the chance of developing breathing (pulmonary) problems (especially infection) following: A long period of time when you are unable to move or be active. BEFORE THE PROCEDURE  If the spirometer includes an indicator to show your best effort, your nurse or respiratory therapist will set it to a desired goal. If possible, sit up straight or lean slightly forward. Try not to slouch. Hold the incentive spirometer in an upright position. INSTRUCTIONS FOR USE  Sit on the edge of your bed if possible, or sit up as far as you can in bed or on a chair. Hold the incentive spirometer in an upright position. Breathe out normally. Place the mouthpiece in your mouth and seal your lips tightly around it. Breathe in slowly and as deeply as possible, raising the piston or the ball toward the top of the column. Hold your breath for 3-5 seconds or for as long as possible. Allow the piston or ball to fall to the bottom of the column. Remove the mouthpiece from your mouth and breathe out normally. Rest for a few seconds and repeat Steps 1 through 7 at least 10 times every 1-2 hours when you are awake. Take your time and take a few normal breaths between deep breaths. The spirometer may include an indicator to show your best effort. Use the indicator as a goal to work toward during each repetition. After each set of 10 deep breaths, practice coughing to be sure your lungs are clear. If you have an incision  (the cut made at the time of surgery), support your incision when coughing by placing a pillow or rolled up towels firmly against it. Once you are able to get out of bed, walk around indoors and cough well. You may stop using the incentive spirometer when instructed by your caregiver.  RISKS AND COMPLICATIONS Take your time so you do not get dizzy or light-headed. If you are in pain, you may need to take or ask for pain medication before doing incentive spirometry. It is harder to take a deep breath if you are having pain. AFTER USE Rest and breathe slowly and easily. It can be helpful to keep track of a log of your progress. Your caregiver can provide you with a simple table to help with this. If you are using the spirometer at home, follow these instructions: [place] IF:  You are having difficultly using the spirometer. You have trouble using the spirometer as often as instructed. Your pain medication is not giving enough relief while using the spirometer. You develop fever of 100.5 F (38.1 C) or higher. SEEK IMMEDIATE MEDICAL CARE IF:  You cough up bloody sputum that had not been present before. You develop fever of 102 F (38.9 C) or greater. You develop worsening pain at or near the incision site. MAKE SURE YOU:  Understand these instructions. Will watch your condition. Will get help right away if you are not doing well or get worse. Document Released: 07/05/2006 Document Revised: 05/17/2011 Document Reviewed: 09/05/2006 [facility] Patient Information 2014 [place], [place].

## 2021-12-23 ENCOUNTER — Encounter (HOSPITAL_COMMUNITY)
Admission: RE | Admit: 2021-12-23 | Discharge: 2021-12-23 | Disposition: A | Discharge status: Home / Self Care | Payer: Medicare Other | Source: Ambulatory Visit | Attending: Orthopedic Surgery | Admitting: Orthopedic Surgery

## 2021-12-23 ENCOUNTER — Encounter (HOSPITAL_COMMUNITY): Payer: Self-pay

## 2021-12-23 VITALS — BP 161/72 | HR 98 | Temp 98.1°F | Resp 14 | Ht 63.0 in | Wt 125.0 lb

## 2021-12-23 DIAGNOSIS — Z01818 Encounter for other preprocedural examination: Secondary | ICD-10-CM | POA: Diagnosis present

## 2021-12-23 DIAGNOSIS — R748 Abnormal levels of other serum enzymes: Secondary | ICD-10-CM | POA: Insufficient documentation

## 2021-12-23 DIAGNOSIS — I10 Essential (primary) hypertension: Secondary | ICD-10-CM | POA: Diagnosis not present

## 2021-12-23 LAB — CBC
HCT: 42.8 % (ref 36.0–46.0)
Hemoglobin: 13.8 g/dL (ref 12.0–15.0)
MCH: 31.4 pg (ref 26.0–34.0)
MCHC: 32.2 g/dL (ref 30.0–36.0)
MCV: 97.3 fL (ref 80.0–100.0)
Platelets: 223 10*3/uL (ref 150–400)
RBC: 4.4 MIL/uL (ref 3.87–5.11)
RDW: 12.3 % (ref 11.5–15.5)
WBC: 6.3 10*3/uL (ref 4.0–10.5)
nRBC: 0 % (ref 0.0–0.2)

## 2021-12-23 LAB — COMPREHENSIVE METABOLIC PANEL
ALT: 29 U/L (ref 0–44)
AST: 24 U/L (ref 15–41)
Albumin: 4.1 g/dL (ref 3.5–5.0)
Alkaline Phosphatase: 58 U/L (ref 38–126)
Anion gap: 7 (ref 5–15)
BUN: 21 mg/dL (ref 8–23)
CO2: 27 mmol/L (ref 22–32)
Calcium: 9.5 mg/dL (ref 8.9–10.3)
Chloride: 108 mmol/L (ref 98–111)
Creatinine, Ser: 1.02 mg/dL — ABNORMAL HIGH (ref 0.44–1.00)
GFR, Estimated: 56 mL/min — ABNORMAL LOW (ref >60)
Glucose, Bld: 91 mg/dL (ref 70–99)
Potassium: 4.7 mmol/L (ref 3.5–5.1)
Sodium: 142 mmol/L (ref 135–145)
Total Bilirubin: 0.7 mg/dL (ref 0.3–1.2)
Total Protein: 7 g/dL (ref 6.5–8.1)

## 2021-12-23 LAB — SURGICAL PCR SCREEN
MRSA, PCR: NEGATIVE
Staphylococcus aureus: NEGATIVE

## 2021-12-23 NOTE — Patient Instructions (Signed)
SURGICAL WAITING ROOM VISITATION Patients having surgery or a procedure may have no more than 2 support people in the waiting area - these visitors may rotate.   Children under the age of 78 must have an adult with them who is not the patient. If the patient needs to stay at the hospital during part of their recovery, the visitor guidelines for inpatient rooms apply. Pre-op nurse will coordinate an appropriate time for 1 support person to accompany patient in pre-op.  This support person may not rotate.    Please refer to the [facility] website for the visitor guidelines for Inpatients (after your surgery is over and you are in a regular room).    Your procedure is scheduled on: 01/05/22   Report to [facility] Main Entrance    Report to admitting at 5:30 AM   Call this number if you have problems the morning of surgery [phone]   Do not eat food :After Midnight.   After Midnight you may have the following liquids until 4:30 AM DAY OF SURGERY  Water Non-Citrus Juices (without pulp, NO RED) Carbonated Beverages Black Coffee (NO MILK/CREAM OR CREAMERS, sugar ok)  Clear Tea (NO MILK/CREAM OR CREAMERS, sugar ok) regular and decaf                             Plain Jell-O (NO RED)                                           Fruit ices (not with fruit pulp, NO RED)                                     Popsicles (NO RED)                                                               Sports drinks like Gatorade (NO RED)              The day of surgery:  Drink ONE (1) Pre-Surgery Clear Ensure at 4:30 AM the morning of surgery. Drink in one sitting. Do not sip.  This drink was given to you during your hospital  pre-op appointment visit. Nothing else to drink after completing the  Pre-Surgery Clear Ensure.          If you have questions, please contact your surgeon's office.   FOLLOW BOWEL PREP AND ANY ADDITIONAL PRE OP INSTRUCTIONS YOU RECEIVED FROM YOUR SURGEON'S OFFICE!!!      Oral Hygiene is also important to reduce your risk of infection.                                    Remember - BRUSH YOUR TEETH THE MORNING OF SURGERY WITH YOUR REGULAR TOOTHPASTE   Take these medicines the morning of surgery with A SIP OF WATER: Carvedilol  You may not have any metal on your body including hair pins, jewelry, and body piercing             Do not wear make-up, lotions, powders, perfumes/cologne, or deodorant  Do not wear nail polish including gel and S&S, artificial/acrylic nails, or any other type of covering on natural nails including finger and toenails. If you have artificial nails, gel coating, etc. that needs to be removed by a nail salon please have this removed prior to surgery or surgery may need to be canceled/ delayed if the surgeon/ anesthesia feels like they are unable to be safely monitored.   Do not shave  48 hours prior to surgery.               Men may shave face and neck.   Do not bring valuables to the hospital. [place].   Contacts, dentures or bridgework may not be worn into surgery.   Bring small overnight bag day of surgery.   DO NOT [place]. PHARMACY WILL DISPENSE MEDICATIONS LISTED ON YOUR MEDICATION LIST TO YOU DURING YOUR ADMISSION [place]!    Patients discharged on the day of surgery will not be allowed to drive home.  Someone NEEDS to stay with you for the first 24 hours after anesthesia.   Special Instructions: Bring a copy of your healthcare power of attorney and living will documents the day of surgery if you haven't scanned them before.              Please read over the following fact sheets you were given: IF [place] [phone]   If you received a COVID test during your pre-op visit  it is requested that you wear a mask when out in public, stay away from  anyone that may not be feeling well and notify your surgeon if you develop symptoms. If you test positive for Covid or have been in contact with anyone that has tested positive in the last 10 days please notify you surgeon.     [facility] - Preparing for Surgery Before surgery, you can play an important role.  Because skin is not sterile, your skin needs to be as free of germs as possible.  You can reduce the number of germs on your skin by washing with CHG (chlorahexidine gluconate) soap before surgery.  CHG is an antiseptic cleaner which kills germs and bonds with the skin to continue killing germs even after washing. Please DO NOT use if you have an allergy to CHG or antibacterial soaps.  If your skin becomes reddened/irritated stop using the CHG and inform your nurse when you arrive at Short Stay. Do not shave (including legs and underarms) for at least 48 hours prior to the first CHG shower.  You may shave your face/neck.  Please follow these instructions carefully:  1.  Shower with CHG Soap the night before surgery and the  morning of surgery.  2.  If you choose to wash your hair, wash your hair first as usual with your normal  shampoo.  3.  After you shampoo, rinse your hair and body thoroughly to remove the shampoo.  4.  Use CHG as you would any other liquid soap.  You can apply chg directly to the skin and wash.  Gently with a scrungie or clean washcloth.  5.  Apply the CHG Soap to your body ONLY FROM THE NECK DOWN.   Do   not use on face/ open                           Wound or open sores. Avoid contact with eyes, ears mouth and   genitals (private parts).                       Wash face,  Genitals (private parts) with your normal soap.             6.  Wash thoroughly, paying special attention to the area where your    surgery  will be performed.  7.  Thoroughly rinse your body with warm water from the neck down.  8.  DO NOT shower/wash with your normal soap  after using and rinsing off the CHG Soap.                9.  Pat yourself dry with a clean towel.            10.  Wear clean pajamas.            11.  Place clean sheets on your bed the night of your first shower and do not  sleep with pets. Day of Surgery : Do not apply any lotions/deodorants the morning of surgery.  Please wear clean clothes to the hospital/surgery center.  FAILURE TO FOLLOW THESE INSTRUCTIONS MAY RESULT IN THE CANCELLATION OF YOUR SURGERY  PATIENT SIGNATURE_________________________________  NURSE SIGNATURE__________________________________  ________________________________________________________________________   Meghan [name]  An incentive spirometer is a tool that can help keep your lungs clear and active. This tool measures how well you are filling your lungs with each breath. Taking long deep breaths may help reverse or decrease the chance of developing breathing (pulmonary) problems (especially infection) following: A long period of time when you are unable to move or be active. BEFORE THE PROCEDURE  If the spirometer includes an indicator to show your best effort, your nurse or respiratory therapist will set it to a desired goal. If possible, sit up straight or lean slightly forward. Try not to slouch. Hold the incentive spirometer in an upright position. INSTRUCTIONS FOR USE  Sit on the edge of your bed if possible, or sit up as far as you can in bed or on a chair. Hold the incentive spirometer in an upright position. Breathe out normally. Place the mouthpiece in your mouth and seal your lips tightly around it. Breathe in slowly and as deeply as possible, raising the piston or the ball toward the top of the column. Hold your breath for 3-5 seconds or for as long as possible. Allow the piston or ball to fall to the bottom of the column. Remove the mouthpiece from your mouth and breathe out normally. Rest for a few seconds and repeat Steps 1 through 7 at  least 10 times every 1-2 hours when you are awake. Take your time and take a few normal breaths between deep breaths. The spirometer may include an indicator to show your best effort. Use the indicator as a goal to work toward during each repetition. After each set of 10 deep breaths, practice coughing to be sure  your lungs are clear. If you have an incision (the cut made at the time of surgery), support your incision when coughing by placing a pillow or rolled up towels firmly against it. Once you are able to get out of bed, walk around indoors and cough well. You may stop using the incentive spirometer when instructed by your caregiver.  RISKS AND COMPLICATIONS Take your time so you do not get dizzy or light-headed. If you are in pain, you may need to take or ask for pain medication before doing incentive spirometry. It is harder to take a deep breath if you are having pain. AFTER USE Rest and breathe slowly and easily. It can be helpful to keep track of a log of your progress. Your caregiver can provide you with a simple table to help with this. If you are using the spirometer at home, follow these instructions: [place] IF:  You are having difficultly using the spirometer. You have trouble using the spirometer as often as instructed. Your pain medication is not giving enough relief while using the spirometer. You develop fever of 100.5 F (38.1 C) or higher. SEEK IMMEDIATE MEDICAL CARE IF:  You cough up bloody sputum that had not been present before. You develop fever of 102 F (38.9 C) or greater. You develop worsening pain at or near the incision site. MAKE SURE YOU:  Understand these instructions. Will watch your condition. Will get help right away if you are not doing well or get worse. Document Released: 07/05/2006 Document Revised: 05/17/2011 Document Reviewed: 09/05/2006 ExitCare Patient Information 2014 [place].   ________________________________________________________________________   Incentive Spirometer  An incentive spirometer is a tool that can help keep your lungs clear and active. This tool measures how well you are filling your lungs with each breath. Taking long deep breaths may help reverse or decrease the chance of developing breathing (pulmonary) problems (especially infection) following: A long period of time when you are unable to move or be active. BEFORE THE PROCEDURE  If the spirometer includes an indicator to show your best effort, your nurse or respiratory therapist will set it to a desired goal. If possible, sit up straight or lean slightly forward. Try not to slouch. Hold the incentive spirometer in an upright position. INSTRUCTIONS FOR USE  Sit on the edge of your bed if possible, or sit up as far as you can in bed or on a chair. Hold the incentive spirometer in an upright position. Breathe out normally. Place the mouthpiece in your mouth and seal your lips tightly around it. Breathe in slowly and as deeply as possible, raising the piston or the ball toward the top of the column. Hold your breath for 3-5 seconds or for as long as possible. Allow the piston or ball to fall to the bottom of the column. Remove the mouthpiece from your mouth and breathe out normally. Rest for a few seconds and repeat Steps 1 through 7 at least 10 times every 1-2 hours when you are awake. Take your time and take a few normal breaths between deep breaths. The spirometer may include an indicator to show your best effort. Use the indicator as a goal to work toward during each repetition. After each set of 10 deep breaths, practice coughing to be sure your lungs are clear. If you have an incision (the cut made at the time of surgery), support your incision when coughing by placing a pillow or rolled up towels firmly against it. Once  you are able to get out of bed, walk around indoors and cough  well. You may stop using the incentive spirometer when instructed by your caregiver.  RISKS AND COMPLICATIONS Take your time so you do not get dizzy or light-headed. If you are in pain, you may need to take or ask for pain medication before doing incentive spirometry. It is harder to take a deep breath if you are having pain. AFTER USE Rest and breathe slowly and easily. It can be helpful to keep track of a log of your progress. Your caregiver can provide you with a simple table to help with this. If you are using the spirometer at home, follow these instructions: [place] IF:  You are having difficultly using the spirometer. You have trouble using the spirometer as often as instructed. Your pain medication is not giving enough relief while using the spirometer. You develop fever of 100.5 F (38.1 C) or higher. SEEK IMMEDIATE MEDICAL CARE IF:  You cough up bloody sputum that had not been present before. You develop fever of 102 F (38.9 C) or greater. You develop worsening pain at or near the incision site. MAKE SURE YOU:  Understand these instructions. Will watch your condition. Will get help right away if you are not doing well or get worse. Document Released: 07/05/2006 Document Revised: 05/17/2011 Document Reviewed: 09/05/2006 ExitCare Patient Information 2014 [place].   ________________________________________________________________________ [facility]- Preparing for Total Shoulder Arthroplasty    Before surgery, you can play an important role. Because skin is not sterile, your skin needs to be as free of germs as possible. You can reduce the number of germs on your skin by using the following products. Benzoyl Peroxide Gel Reduces the number of germs present on the skin Applied twice a day to shoulder area starting two days before surgery    ==================================================================  Please follow these instructions  carefully:  BENZOYL PEROXIDE 5% GEL  Please do not use if you have an allergy to benzoyl peroxide.   If your skin becomes reddened/irritated stop using the benzoyl peroxide.  Starting two days before surgery, apply as follows: Apply benzoyl peroxide in the morning and at night. Apply after taking a shower. If you are not taking a shower clean entire shoulder front, back, and side along with the armpit with a clean wet washcloth.  Place a quarter-sized dollop on your shoulder and rub in thoroughly, making sure to cover the front, back, and side of your shoulder, along with the armpit.   2 days before ____ AM   ____ PM              1 day before ____ AM   ____ PM                         Do this twice a day for two days.  (Last application is the night before surgery, AFTER using the CHG soap as described below).  Do NOT apply benzoyl peroxide gel on the day of surgery.

## 2021-12-23 NOTE — Progress Notes (Signed)
COVID Vaccine Completed: yes  Date of COVID positive in last 90 days:  no  PCP - Frazier Richards, last office visit 07/29/21 Cardiologist - n/a  Chest x-ray - n/a EKG - 12/23/21 Epic/chart Stress Test - more than 10 years per pt ECHO - yes per pt, cannot remember when Cardiac Cath - no Pacemaker/ICD device last checked: no Spinal Cord Stimulator: no  Bowel Prep - no  Sleep Study - n/a CPAP -   Fasting Blood Sugar - n/a Checks Blood Sugar _____ times a day  Blood Thinner Instructions: n/a Aspirin Instructions: Last Dose:  Activity level: Can go up a flight of stairs and perform activities of daily living without stopping and without symptoms of chest pain or shortness of breath.    Anesthesia review:   Patient denies shortness of breath, fever, cough and chest pain at PAT appointment  Patient verbalized understanding of instructions that were given to them at the PAT appointment. Patient was also instructed that they will need to review over the PAT instructions again at home before surgery.

## 2021-12-28 NOTE — H&P (Signed)
SHOULDER ARTHROPLASTY ADMISSION H&P  Patient ID: Meghan [name] MRN: [id number] DOB/AGE: 1940/05/16 81 y.o.  Chief Complaint: right shoulder pain.  Planned Procedure Date: 01/05/22 Medical Clearance by Dr. Ouida Sills     HPI: Meghan [name] is a 81 y.o. female who presents for evaluation of djd right shoulder. The patient has a history of pain and functional disability in the right shoulder due to arthritis and has failed non-surgical conservative treatments for greater than 12 weeks to include NSAID's and/or analgesics, corticosteriod injections, and activity modification.  Onset of symptoms was gradual, starting 1 years ago with rapidlly worsening course since that time. The patient noted no past surgery on the right shoulder.  Patient currently rates pain at 3 out of 10 with activity. Patient has worsening of pain with activity and weight bearing, pain that interferes with activities of daily living, and pain with passive range of motion.  Patient has evidence of rotator cuff arthropathy by imaging studies.  There is no active infection.  Past Medical History:  Diagnosis Date   Abnormal LFTs    Cancer (HCC)    basal cell   CKD (chronic kidney disease), stage III (HCC)    DDD (degenerative disc disease), lumbar    Esophageal stricture    Frequent UTI    GERD (gastroesophageal reflux disease)    Hypercholesterolemia    Hypertension    Lumbar spondylosis    OA (osteoarthritis)    Osteoporosis    Pseudogout    PUD (peptic ulcer disease)    Past Surgical History:  Procedure Laterality Date   APPENDECTOMY     BASAL CELL CARCINOMA EXCISION     CARPAL TUNNEL RELEASE     CERVICAL LAMINECTOMY     COLONOSCOPY     KNEE CARTILAGE SURGERY     ROTATOR CUFF REPAIR     TONSILLECTOMY     TOTAL VAGINAL HYSTERECTOMY     UPPER GI ENDOSCOPY     Allergies  Allergen Reactions   Macrobid [Nitrofurantoin] Shortness Of Breath   Percocet [Oxycodone-Acetaminophen] Shortness Of Breath     Gi-upset   Amlodipine Other (See Comments)    Flushing of the face     Erythromycin     Other reaction(s): Unknown   Gabapentin Nausea Only   Losartan     Other reaction(s): Muscle Pain And benicar   Lovastatin     Other reaction(s): Muscle Pain   Macrolides And Ketolides     Other reaction(s): Unknown   Tetracyclines & Related     Other reaction(s): Unknown   Morphine And Related Rash   Prior to Admission medications   Medication Sig Start Date End Date Taking? Authorizing Provider  Calcium Citrate-Vitamin D (CITRACAL + D PO) Take 1 tablet by mouth daily.   Yes [provider]  Carboxymethylcellulose Sodium (EYE DROPS OP) Place 1 drop into both eyes daily. Wetting solution   Yes [provider]  carvedilol (COREG) 25 MG tablet Take 25 mg by mouth 2 (two) times daily. 11/03/21  Yes [provider]  cholecalciferol (VITAMIN D3) 25 MCG (1000 UNIT) tablet Take 1,000 Units by mouth daily.   Yes [provider]  colchicine 0.6 MG tablet Take 0.6 mg by mouth daily. 11/23/21  Yes [provider]  cyanocobalamin (VITAMIN B12) 1000 MCG tablet Take 1,000 mcg by mouth daily.   Yes [provider]  esomeprazole (NEXIUM) 20 MG capsule Take 20 mg by mouth daily at 12 noon.   Yes [provider]  ibandronate (BONIVA) 150 MG tablet Take 150 mg by mouth every 3 (three) months. Take in the morning with a full glass of water, on an empty stomach, and do not take anything else by mouth or lie down for the next 30 min.   Yes [provider]  spironolactone (ALDACTONE) 50 MG tablet Take 50 mg by mouth daily. 11/30/21  Yes [provider]   Social History   Socioeconomic History   Marital status: Married    Spouse name: Not on file   Number of children: Not on file   Years of education: Not on file   Highest education level: Not on file  Occupational History   Not on file  Tobacco Use   Smoking status: Never   Smokeless  tobacco: Never  Vaping Use   Vaping Use: Never used  Substance and Sexual Activity   Alcohol use: Never   Drug use: Never   Sexual activity: Not on file  Other Topics Concern   Not on file  Social History Narrative   Not on file   Social Determinants of Health   Financial Resource Strain: Not on file  Food Insecurity: Not on file  Transportation Needs: Not on file  Physical Activity: Not on file  Stress: Not on file  Social Connections: Not on file   Family History  Problem Relation Age of Onset   Breast cancer Neg Hx     ROS: Currently denies lightheadedness, dizziness, Fever, chills, CP, SOB.   No personal history of DVT, PE, MI, or CVA. No loose teeth or dentures All other systems have been reviewed and were otherwise currently negative with the exception of those mentioned in the HPI and as above.  BMI: Estimated body mass index is 22.14 kg/m as calculated from the following:   Height as of 12/23/21: '5\' 3"'$  (1.6 m).   Weight as of 12/23/21: 56.7 kg.  Lab Results  Component Value Date   ALBUMIN 4.1 12/23/2021   Diabetes: Patient does not have a diagnosis of diabetes.     Smoking Status:   reports that she has never smoked. She has never used smokeless tobacco.     Objective: Vitals: Ht: '5\' 3"'$  Wt: 129 lbs Temp: 97.4 BP: 143/58 Pulse: 66 O2 97% on room air.   Physical Exam: General: Alert, NAD.   HEENT: EOMI, Good Neck Extension  Pulm: No increased work of breathing.  Clear B/L A/P w/o crackle or wheeze.  CV: RRR, No m/g/r appreciated  GI: soft, NT, ND Neuro: Neuro without gross focal deficit.  Sensation intact distally Skin: No lesions in the area of chief complaint MSK/Surgical Site: right shoulder pain with range of motion.  Forward flexion/abduction approximately 0-160 with pain.  Internal rotation to L1.  External rotation to 0-30.  No AC pain.  No Biceps pain. NVI distally.  Imaging Review Plain radiographs and CT scan demonstrate rotator cuff  arthropathy  Assessment: Right shoulder rotator cuff arthropathy   Plan: Plan for Procedure(s): REVERSE SHOULDER ARTHROPLASTY  The patient history, physical exam, clinical judgement of the provider and imaging are consistent with end stage degenerative joint disease and reverse total joint arthroplasty is deemed medically necessary. The treatment options including medical management, injection therapy, and arthroplasty were discussed at length. The risks and benefits of Procedure(s): REVERSE SHOULDER ARTHROPLASTY were presented and reviewed.  The risks of nonoperative treatment, versus surgical intervention including but not limited to continued pain, aseptic loosening, stiffness, dislocation/subluxation, infection, bleeding, nerve injury, blood clots,  cardiopulmonary complications, morbidity, mortality, among others were discussed. The patient verbalizes understanding and wishes to proceed with the plan.   Dental prophylaxis discussed and recommended for 2 years postoperatively.  The patient does  meet the criteria for TXA which will be used perioperatively.   The patient is planning to be discharged home care of family.   Ventura Bruns, PA-C 12/28/2021 1:28 PM

## 2022-01-04 NOTE — Anesthesia Preprocedure Evaluation (Signed)
Anesthesia Evaluation  Patient identified by MRN, date of birth, ID band Patient awake    Reviewed: Allergy & Precautions, NPO status , Patient's Chart, lab work & pertinent test results, reviewed documented beta blocker date and time   Airway Mallampati: II  TM Distance: >3 FB Neck ROM: Full    Dental  (+) Teeth Intact, Dental Advisory Given   Pulmonary neg pulmonary ROS,    Pulmonary exam normal breath sounds clear to auscultation       Cardiovascular hypertension, Pt. on home beta blockers and Pt. on medications Normal cardiovascular exam Rhythm:Regular Rate:Normal     Neuro/Psych negative neurological ROS     GI/Hepatic Neg liver ROS, PUD, GERD  Medicated,  Endo/Other  negative endocrine ROS  Renal/GU Renal InsufficiencyRenal disease     Musculoskeletal  (+) Arthritis , Osteoarthritis,    Abdominal   Peds  Hematology negative hematology ROS (+)   Anesthesia Other Findings   Reproductive/Obstetrics                            Anesthesia Physical Anesthesia Plan  ASA: 3  Anesthesia Plan: General   Post-op Pain Management: Regional block* and Tylenol PO (pre-op)*   Induction: Intravenous  PONV Risk Score and Plan: 3 and Dexamethasone, Ondansetron and Treatment may vary due to age or medical condition  Airway Management Planned: Oral ETT  Additional Equipment:   Intra-op Plan:   Post-operative Plan: Extubation in OR  Informed Consent: I have reviewed the patients History and Physical, chart, labs and discussed the procedure including the risks, benefits and alternatives for the proposed anesthesia with the patient or authorized representative who has indicated his/her understanding and acceptance.     Dental advisory given  Plan Discussed with: CRNA  Anesthesia Plan Comments:        Anesthesia Quick Evaluation

## 2022-01-05 ENCOUNTER — Ambulatory Visit (HOSPITAL_COMMUNITY): Payer: Medicare Other | Admitting: Anesthesiology

## 2022-01-05 ENCOUNTER — Other Ambulatory Visit: Payer: Self-pay

## 2022-01-05 ENCOUNTER — Ambulatory Visit (HOSPITAL_COMMUNITY)
Admission: RE | Admit: 2022-01-05 | Discharge: 2022-01-05 | Disposition: A | Discharge status: Home / Self Care | Payer: Medicare Other | Source: Ambulatory Visit | Attending: Orthopedic Surgery | Admitting: Orthopedic Surgery

## 2022-01-05 ENCOUNTER — Encounter (HOSPITAL_COMMUNITY): Payer: Self-pay | Admitting: Orthopedic Surgery

## 2022-01-05 ENCOUNTER — Ambulatory Visit (HOSPITAL_BASED_OUTPATIENT_CLINIC_OR_DEPARTMENT_OTHER): Payer: Medicare Other | Admitting: Anesthesiology

## 2022-01-05 ENCOUNTER — Encounter (HOSPITAL_COMMUNITY)
Admission: RE | Disposition: A | Discharge status: Home / Self Care | Payer: Self-pay | Source: Ambulatory Visit | Attending: Orthopedic Surgery

## 2022-01-05 ENCOUNTER — Ambulatory Visit (HOSPITAL_COMMUNITY): Payer: Medicare Other

## 2022-01-05 DIAGNOSIS — I10 Essential (primary) hypertension: Secondary | ICD-10-CM | POA: Diagnosis not present

## 2022-01-05 DIAGNOSIS — Z8711 Personal history of peptic ulcer disease: Secondary | ICD-10-CM | POA: Insufficient documentation

## 2022-01-05 DIAGNOSIS — K219 Gastro-esophageal reflux disease without esophagitis: Secondary | ICD-10-CM | POA: Diagnosis not present

## 2022-01-05 DIAGNOSIS — N289 Disorder of kidney and ureter, unspecified: Secondary | ICD-10-CM | POA: Diagnosis not present

## 2022-01-05 DIAGNOSIS — Z9889 Other specified postprocedural states: Secondary | ICD-10-CM | POA: Diagnosis not present

## 2022-01-05 DIAGNOSIS — M19011 Primary osteoarthritis, right shoulder: Secondary | ICD-10-CM

## 2022-01-05 DIAGNOSIS — N183 Chronic kidney disease, stage 3 unspecified: Secondary | ICD-10-CM | POA: Diagnosis not present

## 2022-01-05 DIAGNOSIS — I129 Hypertensive chronic kidney disease with stage 1 through stage 4 chronic kidney disease, or unspecified chronic kidney disease: Secondary | ICD-10-CM | POA: Diagnosis not present

## 2022-01-05 DIAGNOSIS — R748 Abnormal levels of other serum enzymes: Secondary | ICD-10-CM

## 2022-01-05 HISTORY — DX: Age-related osteoporosis without current pathological fracture: M81.0

## 2022-01-05 HISTORY — PX: REVERSE SHOULDER ARTHROPLASTY: SHX5054

## 2022-01-05 HISTORY — DX: Other intervertebral disc degeneration, lumbar region: M51.36

## 2022-01-05 HISTORY — DX: Pure hypercholesterolemia, unspecified: E78.00

## 2022-01-05 HISTORY — DX: Spondylosis without myelopathy or radiculopathy, lumbar region: M47.816

## 2022-01-05 HISTORY — DX: Esophageal obstruction: K22.2

## 2022-01-05 HISTORY — DX: Peptic ulcer, site unspecified, unspecified as acute or chronic, without hemorrhage or perforation: K27.9

## 2022-01-05 HISTORY — DX: Other chondrocalcinosis, unspecified site: M11.20

## 2022-01-05 HISTORY — DX: Chronic kidney disease, stage 3 unspecified: N18.30

## 2022-01-05 HISTORY — DX: Other intervertebral disc degeneration, lumbar region without mention of lumbar back pain or lower extremity pain: M51.369

## 2022-01-05 HISTORY — DX: Unspecified osteoarthritis, unspecified site: M19.90

## 2022-01-05 HISTORY — DX: Urinary tract infection, site not specified: N39.0

## 2022-01-05 HISTORY — DX: Essential (primary) hypertension: I10

## 2022-01-05 HISTORY — DX: Other specified abnormal findings of blood chemistry: R79.89

## 2022-01-05 HISTORY — DX: Gastro-esophageal reflux disease without esophagitis: K21.9

## 2022-01-05 SURGERY — ARTHROPLASTY, SHOULDER, TOTAL, REVERSE
Anesthesia: General | Site: Shoulder | Laterality: Right

## 2022-01-05 MED ORDER — ONDANSETRON HCL 4 MG/2ML IJ SOLN: 4.0000 mg | Freq: Once | INTRAMUSCULAR | Status: DC | PRN

## 2022-01-05 MED ORDER — BUPIVACAINE LIPOSOME 1.3 % IJ SUSP
INTRAMUSCULAR | Status: DC | PRN
  Administered 2022-01-05: 10 mL via PERINEURAL

## 2022-01-05 MED ORDER — LACTATED RINGERS IV BOLUS
500.0000 mL | Freq: Once | INTRAVENOUS | Status: AC
  Administered 2022-01-05: 500 mL via INTRAVENOUS

## 2022-01-05 MED ORDER — ONDANSETRON HCL 4 MG/2ML IJ SOLN
INTRAMUSCULAR | Status: AC
  Filled 2022-01-05: qty 2

## 2022-01-05 MED ORDER — PHENYLEPHRINE 80 MCG/ML (10ML) SYRINGE FOR IV PUSH (FOR BLOOD PRESSURE SUPPORT)
PREFILLED_SYRINGE | INTRAVENOUS | Status: AC
  Filled 2022-01-05: qty 10

## 2022-01-05 MED ORDER — CHLORHEXIDINE GLUCONATE 0.12 % MT SOLN
15.0000 mL | Freq: Once | OROMUCOSAL | Status: AC
  Administered 2022-01-05: 15 mL via OROMUCOSAL

## 2022-01-05 MED ORDER — CEFAZOLIN SODIUM-DEXTROSE 2-4 GM/100ML-% IV SOLN
2.0000 g | INTRAVENOUS | Status: AC
  Administered 2022-01-05: 2 g via INTRAVENOUS
  Filled 2022-01-05: qty 100

## 2022-01-05 MED ORDER — ACETAMINOPHEN 500 MG PO TABS
1000.0000 mg | ORAL_TABLET | Freq: Once | ORAL | Status: AC
  Administered 2022-01-05: 1000 mg via ORAL
  Filled 2022-01-05: qty 2

## 2022-01-05 MED ORDER — FENTANYL CITRATE (PF) 100 MCG/2ML IJ SOLN
INTRAMUSCULAR | Status: AC
  Filled 2022-01-05: qty 2

## 2022-01-05 MED ORDER — BUPIVACAINE-EPINEPHRINE (PF) 0.25% -1:200000 IJ SOLN
INTRAMUSCULAR | Status: AC
  Filled 2022-01-05: qty 30

## 2022-01-05 MED ORDER — DEXAMETHASONE SODIUM PHOSPHATE 10 MG/ML IJ SOLN
INTRAMUSCULAR | Status: AC
  Filled 2022-01-05: qty 1

## 2022-01-05 MED ORDER — LACTATED RINGERS IV BOLUS
250.0000 mL | Freq: Once | INTRAVENOUS | Status: AC
  Administered 2022-01-05: 250 mL via INTRAVENOUS

## 2022-01-05 MED ORDER — HYDROCODONE-ACETAMINOPHEN 10-325 MG PO TABS: 1.0000 | ORAL_TABLET | ORAL | 0 refills | Status: AC | PRN

## 2022-01-05 MED ORDER — ONDANSETRON HCL 4 MG PO TABS: 4.0000 mg | ORAL_TABLET | Freq: Three times a day (TID) | ORAL | 0 refills | Status: AC | PRN

## 2022-01-05 MED ORDER — ONDANSETRON HCL 4 MG/2ML IJ SOLN
INTRAMUSCULAR | Status: DC | PRN
  Administered 2022-01-05: 4 mg via INTRAVENOUS

## 2022-01-05 MED ORDER — PHENYLEPHRINE HCL-NACL 20-0.9 MG/250ML-% IV SOLN
INTRAVENOUS | Status: DC | PRN
  Administered 2022-01-05: 50 ug/min via INTRAVENOUS

## 2022-01-05 MED ORDER — ROCURONIUM BROMIDE 10 MG/ML (PF) SYRINGE
PREFILLED_SYRINGE | INTRAVENOUS | Status: AC
  Filled 2022-01-05: qty 10

## 2022-01-05 MED ORDER — POVIDONE-IODINE 10 % EX SWAB
2.0000 | Freq: Once | CUTANEOUS | Status: AC
  Administered 2022-01-05: 2 via TOPICAL

## 2022-01-05 MED ORDER — SUGAMMADEX SODIUM 200 MG/2ML IV SOLN
INTRAVENOUS | Status: DC | PRN
  Administered 2022-01-05: 180 mg via INTRAVENOUS

## 2022-01-05 MED ORDER — PROPOFOL 10 MG/ML IV BOLUS
INTRAVENOUS | Status: AC
  Filled 2022-01-05: qty 20

## 2022-01-05 MED ORDER — LIDOCAINE HCL (PF) 2 % IJ SOLN
INTRAMUSCULAR | Status: AC
  Filled 2022-01-05: qty 5

## 2022-01-05 MED ORDER — ORAL CARE MOUTH RINSE: 15.0000 mL | Freq: Once | OROMUCOSAL | Status: AC

## 2022-01-05 MED ORDER — TRANEXAMIC ACID-NACL 1000-0.7 MG/100ML-% IV SOLN
1000.0000 mg | INTRAVENOUS | Status: AC
  Administered 2022-01-05: 1000 mg via INTRAVENOUS
  Filled 2022-01-05: qty 100

## 2022-01-05 MED ORDER — FENTANYL CITRATE (PF) 250 MCG/5ML IJ SOLN
INTRAMUSCULAR | Status: DC | PRN
  Administered 2022-01-05: 50 ug via INTRAVENOUS

## 2022-01-05 MED ORDER — HYDROCODONE-ACETAMINOPHEN 5-325 MG PO TABS
ORAL_TABLET | ORAL | Status: AC
  Administered 2022-01-05: 2 via ORAL
  Filled 2022-01-05: qty 2

## 2022-01-05 MED ORDER — PHENYLEPHRINE 80 MCG/ML (10ML) SYRINGE FOR IV PUSH (FOR BLOOD PRESSURE SUPPORT)
PREFILLED_SYRINGE | INTRAVENOUS | Status: DC | PRN
  Administered 2022-01-05: 160 ug via INTRAVENOUS
  Administered 2022-01-05: 80 ug via INTRAVENOUS

## 2022-01-05 MED ORDER — ACETAMINOPHEN 500 MG PO TABS: 500.0000 mg | ORAL_TABLET | Freq: Four times a day (QID) | ORAL | Status: DC

## 2022-01-05 MED ORDER — FENTANYL CITRATE PF 50 MCG/ML IJ SOSY
PREFILLED_SYRINGE | INTRAMUSCULAR | Status: AC
  Filled 2022-01-05: qty 1

## 2022-01-05 MED ORDER — EPHEDRINE 5 MG/ML INJ
INTRAVENOUS | Status: AC
  Filled 2022-01-05: qty 5

## 2022-01-05 MED ORDER — LACTATED RINGERS IV SOLN: INTRAVENOUS | Status: DC

## 2022-01-05 MED ORDER — SENNA-DOCUSATE SODIUM 8.6-50 MG PO TABS: 2.0000 | ORAL_TABLET | Freq: Every day | ORAL | 1 refills | Status: AC

## 2022-01-05 MED ORDER — LIDOCAINE 2% (20 MG/ML) 5 ML SYRINGE
INTRAMUSCULAR | Status: DC | PRN
  Administered 2022-01-05: 60 mg via INTRAVENOUS

## 2022-01-05 MED ORDER — FENTANYL CITRATE PF 50 MCG/ML IJ SOSY
25.0000 ug | PREFILLED_SYRINGE | INTRAMUSCULAR | Status: DC | PRN
  Administered 2022-01-05: 50 ug via INTRAVENOUS

## 2022-01-05 MED ORDER — DEXAMETHASONE SODIUM PHOSPHATE 10 MG/ML IJ SOLN
INTRAMUSCULAR | Status: DC | PRN
  Administered 2022-01-05: 8 mg via INTRAVENOUS

## 2022-01-05 MED ORDER — EPHEDRINE SULFATE-NACL 50-0.9 MG/10ML-% IV SOSY
PREFILLED_SYRINGE | INTRAVENOUS | Status: DC | PRN
  Administered 2022-01-05: 10 mg via INTRAVENOUS

## 2022-01-05 MED ORDER — HYDROCODONE-ACETAMINOPHEN 5-325 MG PO TABS: 1.0000 | ORAL_TABLET | ORAL | Status: DC | PRN

## 2022-01-05 MED ORDER — ROCURONIUM BROMIDE 10 MG/ML (PF) SYRINGE
PREFILLED_SYRINGE | INTRAVENOUS | Status: DC | PRN
  Administered 2022-01-05: 50 mg via INTRAVENOUS

## 2022-01-05 MED ORDER — STERILE WATER FOR IRRIGATION IR SOLN
Status: DC | PRN
  Administered 2022-01-05: 2000 mL

## 2022-01-05 MED ORDER — PROPOFOL 10 MG/ML IV BOLUS
INTRAVENOUS | Status: DC | PRN
  Administered 2022-01-05: 20 mg via INTRAVENOUS
  Administered 2022-01-05: 120 mg via INTRAVENOUS

## 2022-01-05 MED ORDER — 0.9 % SODIUM CHLORIDE (POUR BTL) OPTIME
TOPICAL | Status: DC | PRN
  Administered 2022-01-05: 1000 mL

## 2022-01-05 MED ORDER — BUPIVACAINE HCL (PF) 0.5 % IJ SOLN
INTRAMUSCULAR | Status: DC | PRN
  Administered 2022-01-05: 10 mL via PERINEURAL

## 2022-01-05 SURGICAL SUPPLY — 65 items
BAG COUNTER SPONGE SURGICOUNT (BAG) IMPLANT
BAG ZIPLOCK 12X15 (MISCELLANEOUS) ×1 IMPLANT
BASEPLATE AUG MED W-TAPER (Plate) IMPLANT
BEARING HUMERAL SHLDER 36M STD (Shoulder) IMPLANT
BIT DRILL 2.7 W/STOP DISP (BIT) IMPLANT
BIT DRILL QUICK REL 1/8 2PK SL (DRILL) IMPLANT
BIT DRILL TWIST 2.7 (BIT) IMPLANT
BLADE SAW SGTL 73X25 THK (BLADE) ×1 IMPLANT
BOOTIES KNEE HIGH SLOAN (MISCELLANEOUS) ×1 IMPLANT
CLSR STERI-STRIP ANTIMIC 1/2X4 (GAUZE/BANDAGES/DRESSINGS) ×1 IMPLANT
COVER BACK TABLE 60X90IN (DRAPES) ×1 IMPLANT
COVER SURGICAL LIGHT HANDLE (MISCELLANEOUS) ×1 IMPLANT
DRAPE ORTHO SPLIT 77X108 STRL (DRAPES) ×2
DRAPE SHEET LG 3/4 BI-LAMINATE (DRAPES) ×2 IMPLANT
DRAPE SURG 17X11 SM STRL (DRAPES) ×1 IMPLANT
DRAPE SURG ORHT 6 SPLT 77X108 (DRAPES) ×2 IMPLANT
DRAPE U-SHAPE 47X51 STRL (DRAPES) ×1 IMPLANT
DRILL QUICK RELEASE 1/8 INCH (DRILL) ×1
DRSG IV TEGADERM 3.5X4.5 STRL (GAUZE/BANDAGES/DRESSINGS) IMPLANT
DRSG MEPILEX POST OP 4X8 (GAUZE/BANDAGES/DRESSINGS) ×1 IMPLANT
DURAPREP 26ML APPLICATOR (WOUND CARE) ×2 IMPLANT
ELECT REM PT RETURN 15FT ADLT (MISCELLANEOUS) ×1 IMPLANT
FACESHIELD WRAPAROUND (MASK) IMPLANT
FACESHIELD WRAPAROUND OR TEAM (MASK) IMPLANT
GLENOID SPHERE STD STRL 36MM (Orthopedic Implant) IMPLANT
GLOVE BIO SURGEON STRL SZ 6.5 (GLOVE) ×1 IMPLANT
GLOVE BIOGEL PI IND STRL 7.0 (GLOVE) ×1 IMPLANT
GLOVE BIOGEL PI IND STRL 8 (GLOVE) ×1 IMPLANT
GLOVE ORTHO TXT STRL SZ7.5 (GLOVE) ×1 IMPLANT
GOWN STRL SURGICAL XL XLNG (GOWN DISPOSABLE) ×2 IMPLANT
GUIDE MODEL REV SHLD RT (ORTHOPEDIC DISPOSABLE SUPPLIES) IMPLANT
HOOD PEEL AWAY FLYTE STAYCOOL (MISCELLANEOUS) ×2 IMPLANT
KIT BASIN OR (CUSTOM PROCEDURE TRAY) ×1 IMPLANT
KIT TURNOVER KIT A (KITS) IMPLANT
PACK SHOULDER (CUSTOM PROCEDURE TRAY) ×1 IMPLANT
PAD ORTHO SHOULDER 7X19 LRG (SOFTGOODS) IMPLANT
PIN THREADED REVERSE (PIN) IMPLANT
PROTECTOR NERVE ULNAR (MISCELLANEOUS) ×1 IMPLANT
REAMER GUIDE BUSHING SURG DISP (MISCELLANEOUS) IMPLANT
REAMER GUIDE W/SCREW AUG (MISCELLANEOUS) IMPLANT
RESTRAINT HEAD UNIVERSAL NS (MISCELLANEOUS) ×1 IMPLANT
SCREW BONE CORT 6.5X35MM (Screw) IMPLANT
SCREW LOCKING 4.75MMX15MM (Screw) IMPLANT
SCREW LOCKING STRL 4.75X25X3.5 (Screw) IMPLANT
SHOULDER HUMERAL BEAR 36M STD (Shoulder) ×1 IMPLANT
SLING ARM IMMOBILIZER LRG (SOFTGOODS) ×1 IMPLANT
SMARTMIX MINI TOWER (MISCELLANEOUS)
SPIKE FLUID TRANSFER (MISCELLANEOUS) IMPLANT
SPONGE GAUZE 2X2 8PLY STRL LF (GAUZE/BANDAGES/DRESSINGS) IMPLANT
SPONGE T-LAP 4X18 ~~LOC~~+RFID (SPONGE) IMPLANT
STEM HUMERAL STRL 13MMX55MM (Stem) IMPLANT
SUCTION FRAZIER HANDLE 12FR (TUBING) ×1
SUCTION TUBE FRAZIER 12FR DISP (TUBING) ×1 IMPLANT
SUPPORT WRAP ARM LG (MISCELLANEOUS) ×1 IMPLANT
SUT MAXBRAID #2 CVD NDL (SUTURE) IMPLANT
SUT MAXBRAID #5 CCS-NDL 2PK (SUTURE) IMPLANT
SUT MNCRL AB 4-0 PS2 18 (SUTURE) IMPLANT
SUT VIC AB 1 CT1 36 (SUTURE) ×1 IMPLANT
SUT VIC AB 2-0 CT1 27 (SUTURE) ×1
SUT VIC AB 2-0 CT1 TAPERPNT 27 (SUTURE) ×1 IMPLANT
SUT VIC AB 3-0 SH 8-18 (SUTURE) ×1 IMPLANT
TOWEL OR 17X26 10 PK STRL BLUE (TOWEL DISPOSABLE) ×1 IMPLANT
TOWEL OR NON WOVEN STRL DISP B (DISPOSABLE) ×1 IMPLANT
TOWER SMARTMIX MINI (MISCELLANEOUS) IMPLANT
TRAY HUM MINI SHOULDER +3 40 (Joint) IMPLANT

## 2022-01-05 NOTE — Anesthesia Procedure Notes (Signed)
Anesthesia Regional Block: Interscalene brachial plexus block   Pre-Anesthetic Checklist: , timeout performed,  Correct Patient, Correct Site, Correct Laterality,  Correct Procedure, Correct Position, site marked,  Risks and benefits discussed,  Surgical consent,  Pre-op evaluation,  At surgeon's request and post-op pain management  Laterality: Right  Prep: chloraprep       Needles:  Injection technique: Single-shot  Needle Type: Echogenic Stimulator Needle     Needle Length: 9cm  Needle Gauge: 22     Additional Needles:   Procedures:,,,, ultrasound used (permanent image in chart),,    Narrative:  Start time: 01/06/2022 7:05 AM End time: 01/06/2022 7:15 AM Injection made incrementally with aspirations every 5 mL.  Performed by: Personally  Anesthesiologist: Santa Lighter, MD  Additional Notes: Functioning IV was confirmed and monitors were applied.  A 23m 22ga Arrow echogenic stimulator needle was used. Sterile prep and drape, hand hygiene, and sterile gloves were used.  Negative aspiration and negative test dose prior to incremental administration of local anesthetic. The patient tolerated the procedure well.  Ultrasound guidance: relevent anatomy identified, needle position confirmed, local anesthetic spread visualized around nerve(s), vascular puncture avoided.  Image printed for medical record.

## 2022-01-05 NOTE — Discharge Instructions (Signed)
Diet: As you were doing prior to hospitalization   Shower/Dressing:  May shower but keep the wounds dry, use an occlusive plastic wrap, NO SOAKING IN TUB.  If the bandage gets wet, change with a clean dry gauze.  Otherwise leave dressing in place until follow-up visit in 2 weeks. There are sticky tapes (steri-strips) on your wounds and all the stitches are absorbable.  Leave the steri-strips in place if need to change your dressings, they will peel off with time, usually 2-3 weeks.  Activity:  Increase activity slowly as tolerated, but follow the weight bearing instructions below.  The rules on driving is that you can not be taking narcotics while you drive, and you must feel in control of the vehicle.    Weight Bearing:   No bearing weight with right arm.  To prevent constipation: you may use a stool softener such as -  Colace (over the counter) 100 mg by mouth twice a day  Drink plenty of fluids (prune juice may be helpful) and high fiber foods Miralax (over the counter) for constipation as needed.    Itching:  If you experience itching with your medications, try taking only a single pain pill, or even half a pain pill at a time.  You may take up to 10 pain pills per day, and you can also use benadryl over the counter for itching or also to help with sleep.   Precautions:  If you experience chest pain or shortness of breath - call 911 immediately for transfer to the hospital emergency department!!  If you develop a fever greater that 101 F, purulent drainage from wound, increased redness or drainage from wound, or calf pain -- Call the office at [phone]                                                Follow- Up Appointment:  Please call for an appointment to be seen in 2 weeks [facility] - [phone]

## 2022-01-05 NOTE — Op Note (Signed)
01/05/2022  9:48 AM  PATIENT:  Meghan [name]    PRE-OPERATIVE DIAGNOSIS: Right shoulder rotator cuff arthropathy  POST-OPERATIVE DIAGNOSIS:  Same with retained peek anchors and high tensile suture  PROCEDURE: RIGHT reverse Total Shoulder Arthroplasty with removal of foreign bodies, peek anchors and high tensile suture from the proximal humerus.  SURGEON:  Johnny Bridge, MD  PHYSICIAN ASSISTANT: Merlene Pulling, PA-C, present and scrubbed throughout the case, critical for completion in a timely fashion, and for retraction, instrumentation, and closure.  ANESTHESIA:   General with interscalene block using Exparel  ESTIMATED BLOOD LOSS: 150 mL  UNIQUE ASPECTS OF THE CASE: Bone quality was relatively poor.  I used the guides to minimize bone loss on the glenoid.  I placed the augment posterior superiorly.  I also tried to make sure I did not reconstruct her shoulder with too much tightness given her substantial acromioplasty, and weak bone.  Ultimately I did not have much shuck, but I was able to achieve 2 finger tightness on the reduction.  PREOPERATIVE INDICATIONS:  [name] is a  81 y.o. female with a diagnosis of d right shoulder rotator cuff arthropathy history of previous rotator cuff repair who failed conservative measures and elected for surgical management.    The risks benefits and alternatives were discussed with the patient preoperatively including but not limited to the risks of infection, bleeding, nerve injury, cardiopulmonary complications, the need for revision surgery, dislocation, brachial plexus palsy, incomplete relief of pain, among others, and the patient was willing to proceed.  Implant Name Type Inv. Item Serial No. Manufacturer Lot No. LRB No. Used Action  BASEPLATE AUG MED W-TAPER - [id number] Plate BASEPLATE AUG MED W-TAPER  [employer](ORTH,TRAU,BIO,SG) [id number] Right 1 Implanted  Fixed Locking Screw     [id number] Right 1 Implanted  SCREW LOCKING  4.75MMX15MM - [id number] Screw SCREW LOCKING 4.75MMX15MM  [employer](ORTH,TRAU,BIO,SG) [id number] Right 1 Implanted  GLENOID SPHERE STD STRL 28MM - [id number] Orthopedic Implant GLENOID SPHERE STD STRL 28MM  [employer](ORTH,TRAU,BIO,SG) [id number] Right 1 Implanted  SCREW LOCKING 4.75MMX15MM - [id number] Screw SCREW LOCKING 4.75MMX15MM  [employer](ORTH,TRAU,BIO,SG) [id number] Right 1 Implanted  SCREW LOCKING 4.75MMX15MM - [id number] Screw SCREW LOCKING 4.75MMX15MM  [employer](ORTH,TRAU,BIO,SG) [id number] Right 1 Implanted  SCREW BONE CORT 6.5X35MM - [id number] Screw SCREW BONE CORT 6.5X35MM  [employer](ORTH,TRAU,BIO,SG) [id number] Right 1 Implanted  SHOULDER HUMERAL BEAR 28M STD - [id number] Shoulder SHOULDER HUMERAL BEAR 28M STD  [employer](ORTH,TRAU,BIO,SG) [id number] Right 1 Implanted  TRAY HUM MINI SHOULDER +3 40 - [id number] Joint TRAY HUM MINI SHOULDER +3 40  [employer](ORTH,TRAU,BIO,SG) [id number] Right 1 Implanted  STEM HUMERAL STRL 13MMX55MM - [id number] Stem STEM HUMERAL STRL 13MMX55MM  [employer](ORTH,TRAU,BIO,SG) [id number] Right 1 Implanted     OPERATIVE FINDINGS: Advanced rotator cuff arthropathy, the biceps was extremely flattened, and the subscapularis was fairly poor quality proximally although I did have a decent amount of tendon and work with distally.  There is no supraspinatus, and fairly minimal infraspinatus present.  OPERATIVE PROCEDURE: The patient was brought to the operating room and placed in the supine position. General anesthesia was administered. IV antibiotics were given.  Time out was performed. The upper extremity was prepped and draped in usual sterile fashion. The patient was in a beachchair position. Deltopectoral approach was carried out. The biceps was tenodesed to the pectoralis tendon with #2 max braid. The subscapularis was released off of the bone.   I then performed circumferential releases of the humerus, and then dislocated the  head, and then reamed with the  reamer to the above named size.  I then applied the jig, and cut the humeral head in 30 of retroversion, and then turned my attention to the glenoid.  Deep retractors were placed, and I resected the labrum, and then placed a guidepin into the center position on the glenoid, with slight inferior inclination. I then reamed over the guidepin, and this created a small metaphyseal cancellus blush inferiorly, removing just the cartilage to the subchondral bone superiorly.  I used the guide in order to position the guidewire, and then also reamed posterosuperiorly to prepare the final augmented component.  The base plate was selected and impacted place, and then I secured it centrally with a nonlocking screw, and I had excellent purchase both inferiorly and superiorly. I placed a short locking screws on anterior and posterior aspects.  I then turned my attention to the glenosphere, and impacted this into place, placing slight inferior offset (set on B).   The glenosphere was completely seated, and had engagement of the [facility] taper. I then turned my attention back to the humerus.  I sequentially broached, and then trialed, and was found to restore soft tissue tension, and it had 2 finger tightness. Therefore the above named components were selected. The shoulder felt stable throughout functional motion.  Before I placed the real prosthesis I had also placed a total of 2 #5 max braid and 1 #2 max braid through drill holes in the humerus for later subscapularis repair.  I then impacted the real prosthesis into place, as well as the real humeral tray, and reduced the shoulder. The shoulder had excellent motion, and was stable, and I irrigated the wounds copiously.    I then used these to repair the subscapularis. This came down to bone.  I then irrigated the shoulder copiously once more, repaired the deltopectoral interval with Vicryl followed by subcutaneous Vicryl with Steri-Strips and sterile gauze for  the skin. The patient was awakened and returned back in stable and satisfactory condition. There were no complications and She tolerated the procedure well.

## 2022-01-05 NOTE — Evaluation (Signed)
Occupational Therapy Evaluation and Discharge Patient Details Name: Meghan [name] MRN: [id number] DOB: 08-23-1940 Today's Date: 01/05/2022   History of Present Illness Pt is an 81 y/o f admitted 01/05/22 for scheduled R TSA.  PMh includes Cancer, CKD stage III, DDD lumbar, Esophageal stricture, Frequent UTI, HTN, Lumbar spondylosis, OA, Osteoporosis, Pseudogout, PUD, Carpal tunnel release; Knee cartilage surgery; Colonoscopy; R Rotator cuff repair; Cervical laminectomy; Tonsillectomy; Total vaginal hysterectomy; and Upper gi endoscopy.   Clinical Impression   Pt is typically independent in ADL and mobility.  Today R shoulder and arm were numb from block during evaluation - so all exercises as listed below were demonstrated only. Pt verbalized understanding and handout provided. Pt educated on conservative Shoulder precautions (handout provided): Educated patient sling schedule and for don doff sling, husband present and verbalizing understanding. Educated on washing under arms with cloth via dangle method, avoiding shoulder movement. Educated on positioning with pillows in chair, sleeping positioning (recommend recliner if possible), pt educated on sequencing during dressing and got Pt dressed at end of session.  Home exercise program for elbow, wrist and hand reviewed x2 and provided with handout as stated below (indicated by MD). Educated and provided handout for [employer] as well. Pt was feeling a little lightheaded with positional changes - BP WFL. RN aware. Pt in recliner and sipping gingerale/eating crackers at end of session with RN. OT education complete and OT will defer further therapy to MD and post-acute follow up appointment. OT to sign off at this time.       Recommendations for follow up therapy are one component of a multi-disciplinary discharge planning process, led by the attending physician.  Recommendations may be updated based on patient status, additional functional  criteria and insurance authorization.   Follow Up Recommendations  Follow physician's recommendations for discharge plan and follow up therapies    Assistance Recommended at Discharge Frequent or constant Supervision/Assistance (initially)  Patient can return home with the following A little help with walking and/or transfers;A lot of help with bathing/dressing/bathroom;Assistance with cooking/housework;Assist for transportation;Help with stairs or ramp for entrance    Functional Status Assessment  Patient has had a recent decline in their functional status and demonstrates the ability to make significant improvements in function in a reasonable and predictable amount of time.  Equipment Recommendations  None recommended by OT    Recommendations for Other Services       Precautions / Restrictions Precautions Precautions: Shoulder Type of Shoulder Precautions: conservative Shoulder Interventions: Shoulder sling/immobilizer;At all times;Off for dressing/bathing/exercises Precaution Booklet Issued: Yes (comment) Precaution Comments: H, W, E exercises only, NWB Required Braces or Orthoses: Sling Restrictions Weight Bearing Restrictions: Yes RUE Weight Bearing: Non weight bearing Other Position/Activity Restrictions: hand, wrist, elbow ROM only - no shoulder movement      Mobility Bed Mobility Overal bed mobility: Needs Assistance Bed Mobility: Supine to Sit     Supine to sit: Supervision     General bed mobility comments: for safety- no attempt to push through arm    Transfers Overall transfer level: Needs assistance Equipment used: 1 person hand held assist Transfers: Sit to/from Stand Sit to Stand: Min guard, From elevated surface           General transfer comment: Pt able to stand for LB dressing, reporting woozy/dizziness. BP WFL.      Balance  ADL either performed or assessed with clinical  judgement   ADL                                         General ADL Comments: see shoulder section below     Vision         Perception     Praxis      Pertinent Vitals/Pain Pain Assessment Pain Assessment: No/denies pain (block still in place - no feeling in RUE at this time)     Hand Dominance Right   Extremity/Trunk Assessment Upper Extremity Assessment Upper Extremity Assessment: RUE deficits/detail RUE Deficits / Details: post-op block still in place. no sensation or movement at this time RUE Sensation: decreased light touch;decreased proprioception RUE Coordination: decreased fine motor;decreased gross motor       Cervical / Trunk Assessment Cervical / Trunk Assessment: Kyphotic   Communication Communication Communication: No difficulties (a little HOH)   Cognition Arousal/Alertness: Awake/alert, Suspect due to medications Behavior During Therapy: WFL for tasks assessed/performed, Flat affect Overall Cognitive Status: Impaired/Different from baseline Area of Impairment: Memory                     Memory: Decreased short-term memory (husband present for all information too)         General Comments: suspect medication. Husband present for education, also provided handout     General Comments       Exercises Exercises: Shoulder Shoulder Exercises Elbow Flexion: Self ROM, Right, Seated (educated: demo and handout only - block in place) Elbow Extension: Right, Self ROM, Seated (educated: demo and handout only - block in place) Wrist Flexion: AROM, Right (educated: demo and handout only - block in place) Wrist Extension: AROM, Right (educated: demo and handout only - block in place) Digit Composite Flexion: AROM, Right (educated: demo and handout only - block in place) Composite Extension: AROM, Right (educated: demo and handout only - block in place)   Shoulder Instructions Shoulder Instructions Donning/doffing shirt without  moving shoulder: Maximal assistance;Caregiver independent with task Method for sponge bathing under operated UE: Min-guard Donning/doffing sling/immobilizer: Maximal assistance;Caregiver independent with task Correct positioning of sling/immobilizer: Maximal assistance;Caregiver independent with task Pendulum exercises (written home exercise program):  (n/a) ROM for elbow, wrist and digits of operated UE: Supervision/safety (demo-ed not performed due to block) Sling wearing schedule (on at all times/off for ADL's): Independent Proper positioning of operated UE when showering: Supervision/safety Positioning of UE while sleeping: Minimal assistance    Home Living Family/patient expects to be discharged to:: Private residence Living Arrangements: Spouse/significant other Available Help at Discharge: Family;Available 24 hours/day Type of Home: House       Home Layout: One level                          Prior Functioning/Environment Prior Level of Function : Independent/Modified Independent                        OT Problem List: Decreased range of motion;Impaired balance (sitting and/or standing);Decreased knowledge of precautions;Impaired UE functional use;Pain      OT Treatment/Interventions:      OT Goals(Current goals can be found in the care plan section) Acute Rehab OT Goals Patient Stated Goal: get back to gardening OT Goal Formulation: With patient/family Time For Goal Achievement: 01/19/22 Potential  to Achieve Goals: Good  OT Frequency:      Co-evaluation              AM-PAC OT "6 Clicks" Daily Activity     Outcome Measure Help from another person eating meals?: A Little Help from another person taking care of personal grooming?: A Little Help from another person toileting, which includes using toliet, bedpan, or urinal?: A Little Help from another person bathing (including washing, rinsing, drying)?: A Little Help from another person to put  on and taking off regular upper body clothing?: A Lot Help from another person to put on and taking off regular lower body clothing?: A Lot 6 Click Score: 16   End of Session Equipment Utilized During Treatment: Other (comment) (sling) Nurse Communication: Mobility status  Activity Tolerance: Patient tolerated treatment well Patient left: in chair;with nursing/sitter in room  OT Visit Diagnosis: Pain;Muscle weakness (generalized) (M62.81) Pain - Right/Left: Right Pain - part of body: Shoulder                Time: 3845-3646 OT Time Calculation (min): 44 min Charges:  OT General Charges $OT Visit: 1 Visit OT Evaluation $OT Eval Moderate Complexity: 1 Mod OT Treatments $Self Care/Home Management : 8-22 mins  Jesse Sans OTR/L Acute Rehabilitation Services Office: [phone]  [facility] 01/05/2022, 12:46 PM

## 2022-01-05 NOTE — Transfer of Care (Signed)
Immediate Anesthesia Transfer of Care Note  Patient: VASSIE KUGEL  Procedure(s) Performed: REVERSE SHOULDER ARTHROPLASTY (Right: Shoulder)  Patient Location: PACU  Anesthesia Type:General  Level of Consciousness: oriented, drowsy and patient cooperative  Airway & Oxygen Therapy: Patient Spontanous Breathing and Patient connected to face mask oxygen  Post-op Assessment: Report given to RN and Post -op Vital signs reviewed and stable  Post vital signs: Reviewed  Last Vitals:  Vitals Value Taken Time  BP 132/69 01/05/22 1010  Temp    Pulse 65 01/05/22 1014  Resp 17 01/05/22 1014  SpO2 96 % 01/05/22 1014  Vitals shown include unvalidated device data.  Last Pain:  Vitals:   01/05/22 4917  TempSrc:   PainSc: 0-No pain         Complications: No notable events documented.

## 2022-01-05 NOTE — Interval H&P Note (Signed)
History and Physical Interval Note:  01/05/2022 7:16 AM  Meghan [name]  has presented today for surgery, with the diagnosis of djd right shoulder.  The various methods of treatment have been discussed with the patient and family. After consideration of risks, benefits and other options for treatment, the patient has consented to  Procedure(s): REVERSE SHOULDER ARTHROPLASTY (Right) as a surgical intervention.  The patient's history has been reviewed, patient examined, no change in status, stable for surgery.  I have reviewed the patient's chart and labs.  Questions were answered to the patient's satisfaction.     Johnny Bridge

## 2022-01-05 NOTE — Brief Op Note (Signed)
Skin Tear noted on the bicep area of right arm 2x2 gauze and tegaderm dressing applied  Surgeon informed

## 2022-01-05 NOTE — Progress Notes (Signed)
Family left discharge summary behind writer called husband he is coming back to pick up instructions.

## 2022-01-05 NOTE — Anesthesia Procedure Notes (Signed)
Procedure Name: Intubation Date/Time: 01/05/2022 7:33 AM  Performed by: Jenne Campus, CRNAPre-anesthesia Checklist: Patient identified, Emergency Drugs available, Suction available and Patient being monitored Patient Re-evaluated:Patient Re-evaluated prior to induction Oxygen Delivery Method: Circle System Utilized Preoxygenation: Pre-oxygenation with 100% oxygen Induction Type: IV induction Ventilation: Mask ventilation without difficulty Laryngoscope Size: Miller and 3 Grade View: Grade I Tube type: Oral Tube size: 7.0 mm Number of attempts: 1 Airway Equipment and Method: Stylet and Oral airway Placement Confirmation: ETT inserted through vocal cords under direct vision, positive ETCO2 and breath sounds checked- equal and bilateral Secured at: 22 cm Tube secured with: Tape Dental Injury: Teeth and Oropharynx as per pre-operative assessment

## 2022-01-06 ENCOUNTER — Encounter (HOSPITAL_COMMUNITY): Payer: Self-pay | Admitting: Orthopedic Surgery

## 2022-01-06 NOTE — Anesthesia Postprocedure Evaluation (Signed)
Anesthesia Post Note  Patient: Meghan [name]  Procedure(s) Performed: REVERSE SHOULDER ARTHROPLASTY (Right: Shoulder)     Patient location during evaluation: PACU Anesthesia Type: General Level of consciousness: awake and alert Pain management: pain level controlled Vital Signs Assessment: post-procedure vital signs reviewed and stable Respiratory status: spontaneous breathing, nonlabored ventilation, respiratory function stable and patient connected to nasal cannula oxygen Cardiovascular status: blood pressure returned to baseline and stable Postop Assessment: no apparent nausea or vomiting Anesthetic complications: no   No notable events documented.  Last Vitals:  Vitals:   01/05/22 1145 01/05/22 1215  BP: 138/69 137/69  Pulse: (!) 56 (!) 56  Resp: 15 15  Temp: (!) 36.4 C (!) 36.4 C  SpO2: 92% 94%    Last Pain:  Vitals:   01/05/22 1215  TempSrc:   PainSc: 0-No pain                 Santa Lighter

## 2022-01-06 NOTE — Addendum Note (Signed)
Addendum  created 01/06/22 1056 by Santa Lighter, MD   Clinical Note Signed, Intraprocedure Blocks edited, SmartForm saved

## 2022-01-10 ENCOUNTER — Encounter (HOSPITAL_COMMUNITY): Payer: Self-pay

## 2022-01-10 ENCOUNTER — Emergency Department (HOSPITAL_COMMUNITY)
Admission: EM | Admit: 2022-01-10 | Discharge: 2022-01-10 | Discharge status: Against Medical Advice | Payer: Medicare Other | Attending: Emergency Medicine | Admitting: Emergency Medicine

## 2022-01-10 DIAGNOSIS — Z5321 Procedure and treatment not carried out due to patient leaving prior to being seen by health care provider: Secondary | ICD-10-CM | POA: Insufficient documentation

## 2022-01-10 DIAGNOSIS — M25511 Pain in right shoulder: Secondary | ICD-10-CM | POA: Insufficient documentation

## 2022-01-10 DIAGNOSIS — R200 Anesthesia of skin: Secondary | ICD-10-CM | POA: Insufficient documentation

## 2022-01-10 DIAGNOSIS — M79601 Pain in right arm: Secondary | ICD-10-CM | POA: Diagnosis present

## 2022-01-10 NOTE — ED Provider Triage Note (Signed)
Emergency Medicine Provider Triage Evaluation Note  Meghan [name] , a 81 y.o. female  was evaluated in triage.  Pt complains of swelling and discomfort from shoulder replacement.  Pt had surgery by Dr. Mardelle Matte on 10/31. .  Review of Systems  Positive: Pain and numbness Negative: fever  Physical Exam  BP (!) 177/69 (BP Location: Left Arm)   Pulse 72   Temp 98.3 F (36.8 C) (Oral)   Resp 18   SpO2 95%  Gen:   Awake, no distress   Resp:  Normal effort  MSK:   Moves extremities without difficulty  Other:    Medical Decision Making  Medically screening exam initiated at 2:28 PM.  Appropriate orders placed.  Meghan [name] was informed that the remainder of the evaluation will be completed by another provider, this initial triage assessment does not replace that evaluation, and the importance of remaining in the ED until their evaluation is complete.     Fransico Meadow, [place] 01/10/22 1429

## 2022-01-10 NOTE — ED Triage Notes (Signed)
Pt arrived via POV, c/o continued pain and stiff ness to right shoulder. Had surgery 01/05/22

## 2022-06-02 ENCOUNTER — Other Ambulatory Visit: Payer: Self-pay

## 2022-06-02 ENCOUNTER — Encounter: Payer: Self-pay | Admitting: Neurology

## 2022-06-02 DIAGNOSIS — R202 Paresthesia of skin: Secondary | ICD-10-CM

## 2022-06-29 ENCOUNTER — Ambulatory Visit (INDEPENDENT_AMBULATORY_CARE_PROVIDER_SITE_OTHER): Payer: Medicare Other | Admitting: Neurology

## 2022-06-29 DIAGNOSIS — R202 Paresthesia of skin: Secondary | ICD-10-CM | POA: Diagnosis not present

## 2022-06-29 DIAGNOSIS — M5412 Radiculopathy, cervical region: Secondary | ICD-10-CM

## 2022-06-29 DIAGNOSIS — G5601 Carpal tunnel syndrome, right upper limb: Secondary | ICD-10-CM

## 2022-06-29 DIAGNOSIS — G5621 Lesion of ulnar nerve, right upper limb: Secondary | ICD-10-CM

## 2022-06-29 NOTE — Procedures (Signed)
[facility] Neurology  [address] [place], [address]  [place], [place] [address] Tel: [phone] Fax: [phone] Test Date:  06/29/2022  Patient: Meghan [name] DOB: January 13, 1941 Physician: Jacquelyne Balint, MD  Sex: Female Height:  Ref Phys: Teryl Lucy, MD  ID#: [id number]   Technician:    History: This is an 82 year old female with right hand numbness.  NCV & EMG Findings: Extensive electrodiagnostic evaluation of the right upper limb with additional nerve conduction studies of the left upper limb shows: Bilateral median sensory responses show reduced amplitudes (L8, R8 V). Right ulnar sensory response shows prolonged distal peak latency (3.5 ms) and reduced amplitude (4 V). Right radial and left ulnar sensory responses are within normal limits. Left median (APB) motor response shows reduced amplitude (4.7 mV). Right ulnar (ADM) motor response shows reduced conduction velocity across the elbow (40 m/s). Right median (APB) and left ulnar (ADM) motor responses are within normal limits. Chronic motor axon loss changes WITH active denervation changes are seen in the right flexor carpi ulnaris muscle. Chronic motor axon loss changes WITHOUT active denervation changes are seen in the right first dorsal interosseous, abductor digiti minimi, abductor pollicis brevis, extensor indicis proprius, flexor pollicis longus, pronator teres, and triceps muscles.  Impression: This is a complex evaluation. 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 There are active denervation changes proximally and atrophy distally. The findings are moderate in degree electrically. 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  Cervical paraspinal muscles were not evaluated due to prior cervical spine surgery. 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    ___________________________ Jacquelyne Balint, MD    Nerve Conduction Studies Motor Nerve Results    Latency Amplitude F-Lat Segment Distance CV Comment  Site (ms) Norm (mV) Norm (ms)  (cm) (m/s) Norm   Left Median (APB) Motor  Wrist 2.9  < 4.0 *4.7  > 5.0        Right Median (APB) Motor  Wrist 3.6  < 4.0 5.3  > 5.0        Elbow 8.6 - 3.9 -  Elbow-Wrist 25 50  > 50   Left Ulnar (ADM) Motor  Wrist 1.70  < 3.1 8.7  > 7.0        Bel elbow 5.5 - 8.0 -  Bel elbow-Wrist 20 53  > 50   Ab elbow 7.5 - 7.8 -  Ab elbow-Bel elbow 10 50 -   Right Ulnar (ADM) Motor  Wrist 2.1  < 3.1 7.8  > 7.0 30.9       Bel elbow 5.9 - 7.0 -  Bel elbow-Wrist 20 53  > 50   Ab elbow 8.4 - 6.9 -  Ab elbow-Bel elbow 10 40 -    Sensory Sites    Neg Peak Lat Amplitude (O-P) Segment Distance Velocity Comment  Site (ms) Norm (V) Norm  (cm) (ms)   Left Median Sensory  Wrist-Dig II 3.4  < 3.8 *8  > 10 Wrist-Dig II 13    Right Median Sensory  Wrist-Dig II 3.5  < 3.8 *8  > 10 Wrist-Dig II 13    Right Radial Sensory  Forearm-Wrist 2.3  < 2.8 20  > 10 Forearm-Wrist 10    Left Ulnar Sensory  Wrist-Dig V 2.7  < 3.2 11  > 5 Wrist-Dig V 11    Right Ulnar Sensory  Wrist-Dig V *3.5  < 3.2 *4  > 5 Wrist-Dig V 11     Electromyography   Side Muscle Ins.Act Fibs Fasc Recrt Amp Dur Poly Activation Comment  Right FDI Nml Nml Nml *3- *1+ *2+ *2+ Nml N/A  Right ADM Nml Nml Nml *2- *1+ *1+ *2+ Nml N/A  Right EIP Nml Nml Nml *1- *1+ *1+ *1+ Nml N/A  Right FPL Nml Nml Nml *1- *1+ *1+ *1+ Nml N/A  Right APB Nml Nml Nml *SMU *1+ *1+ *1+ Nml N/A  Right Pronator teres Nml Nml Nml *3- *1+ *1+ *2+ Nml N/A  Right FCU Nml *1+ Nml *2- *1+ *1+ *1+ Nml N/A  Right Biceps Nml Nml Nml Nml Nml Nml Nml Nml N/A  Right  Triceps Nml Nml Nml *2- *1+ *1+ *1+ Nml N/A  Right Deltoid Nml Nml Nml Nml Nml Nml Nml Nml N/A      Waveforms:  Motor           Sensory             F-Wave

## 2022-07-07 ENCOUNTER — Other Ambulatory Visit: Payer: Self-pay | Admitting: Internal Medicine

## 2022-07-07 DIAGNOSIS — Z1231 Encounter for screening mammogram for malignant neoplasm of breast: Secondary | ICD-10-CM

## 2022-07-14 ENCOUNTER — Ambulatory Visit
Admission: RE | Admit: 2022-07-14 | Discharge: 2022-07-14 | Disposition: A | Discharge status: Home / Self Care | Payer: Medicare Other | Source: Ambulatory Visit | Attending: Internal Medicine | Admitting: Internal Medicine

## 2022-07-14 DIAGNOSIS — Z1231 Encounter for screening mammogram for malignant neoplasm of breast: Secondary | ICD-10-CM | POA: Insufficient documentation

## 2023-06-29 IMAGING — MG MM DIGITAL SCREENING BILAT W/ TOMO AND CAD
8 series · 8 of 24 positions shown · non-contrast
Comparison: Previous exam(s).

CLINICAL DATA: Screening.

EXAM:
DIGITAL SCREENING BILATERAL MAMMOGRAM WITH TOMOSYNTHESIS AND CAD
TECHNIQUE: Bilateral screening digital craniocaudal and mediolateral oblique
mammograms were obtained. Bilateral screening digital breast
tomosynthesis was performed. The images were evaluated with
computer-aided detection.

[R MLO synth-2D]
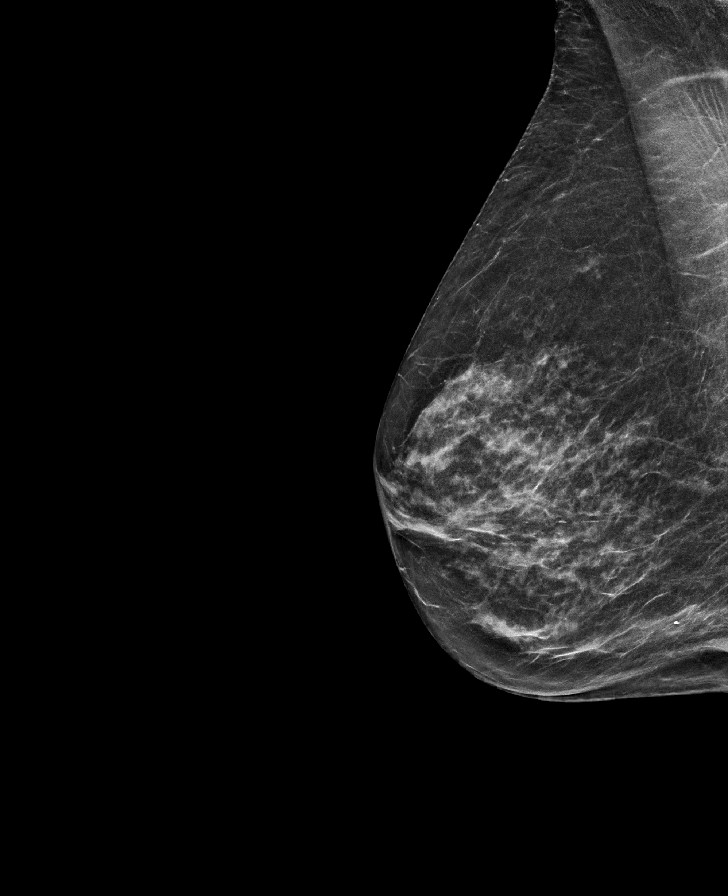

[L CC synth-2D]
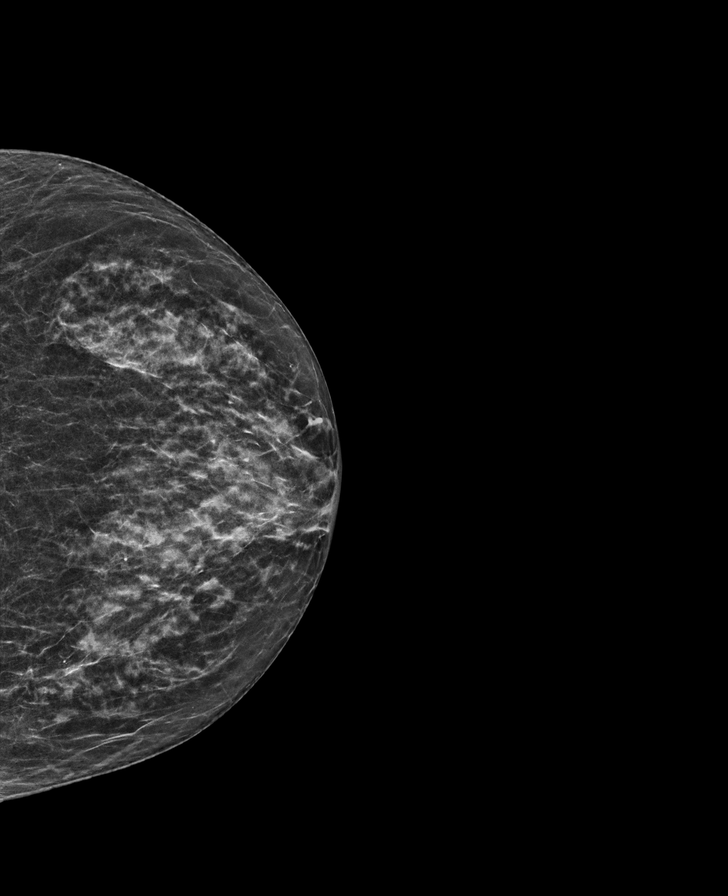

[R CC synth-2D]
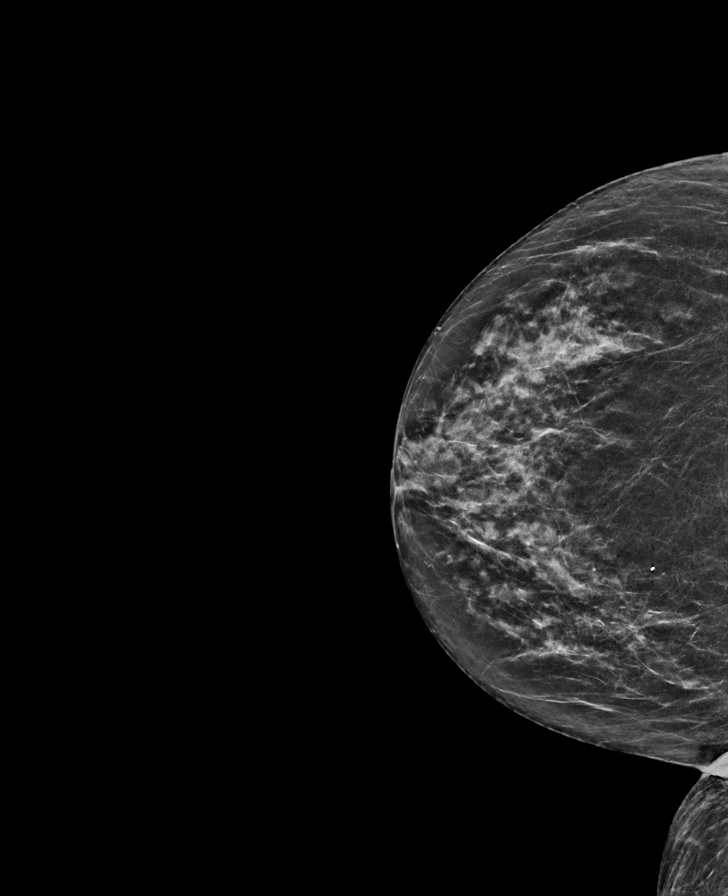

[L MLO synth-2D]
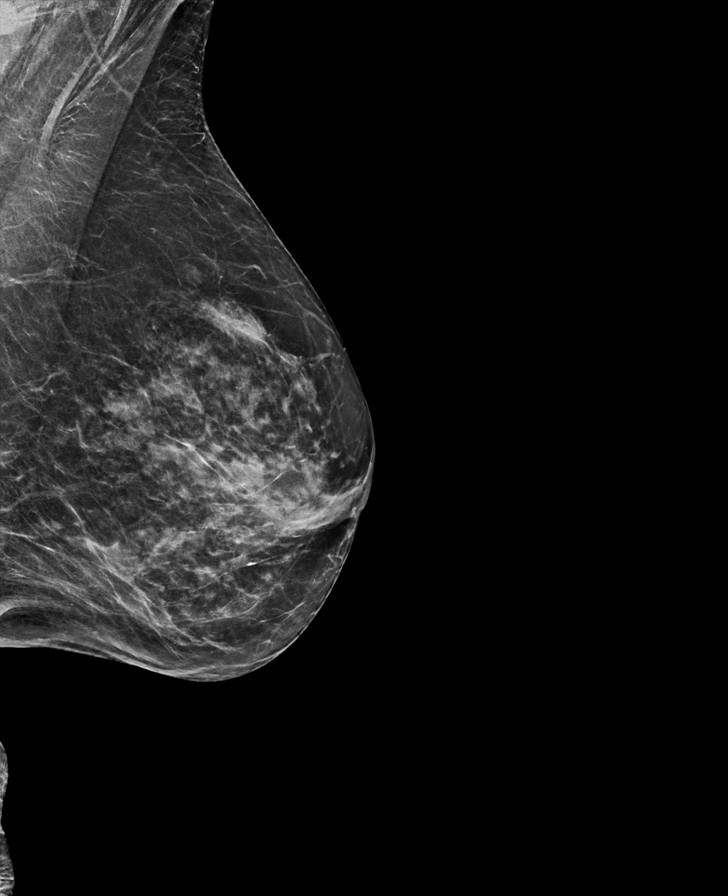

[R CC tomo · tomo slice 26/51.0]
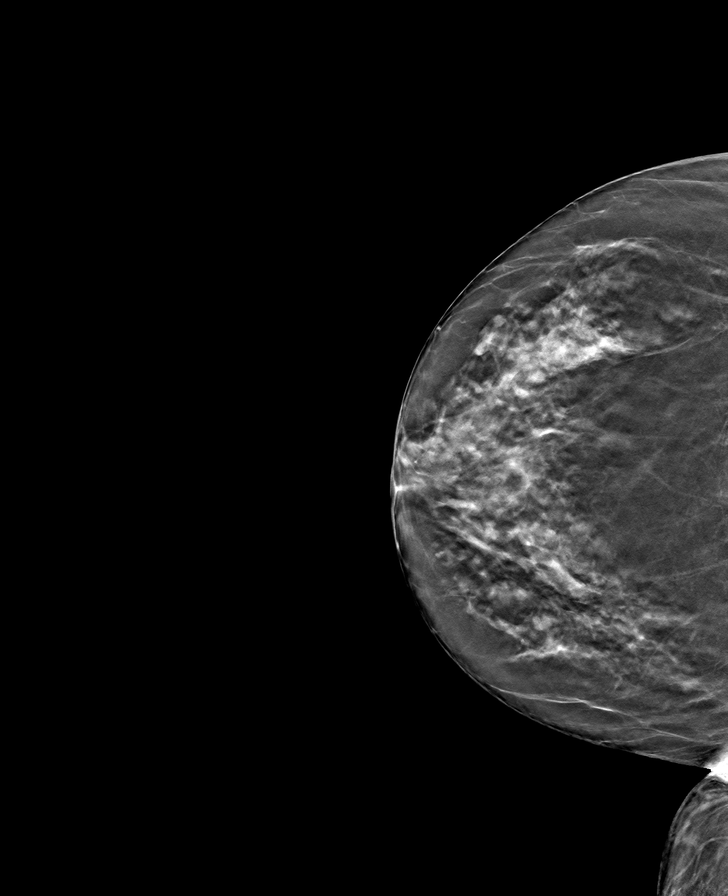

[R MLO tomo · tomo slice 28/55.0]
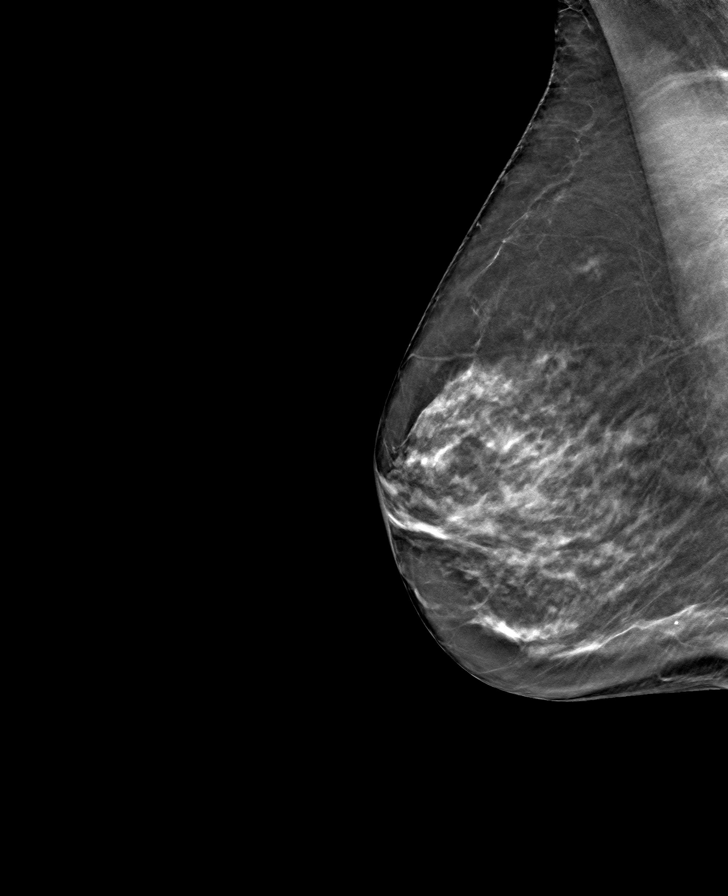

[L CC tomo · tomo slice 25/50.0]
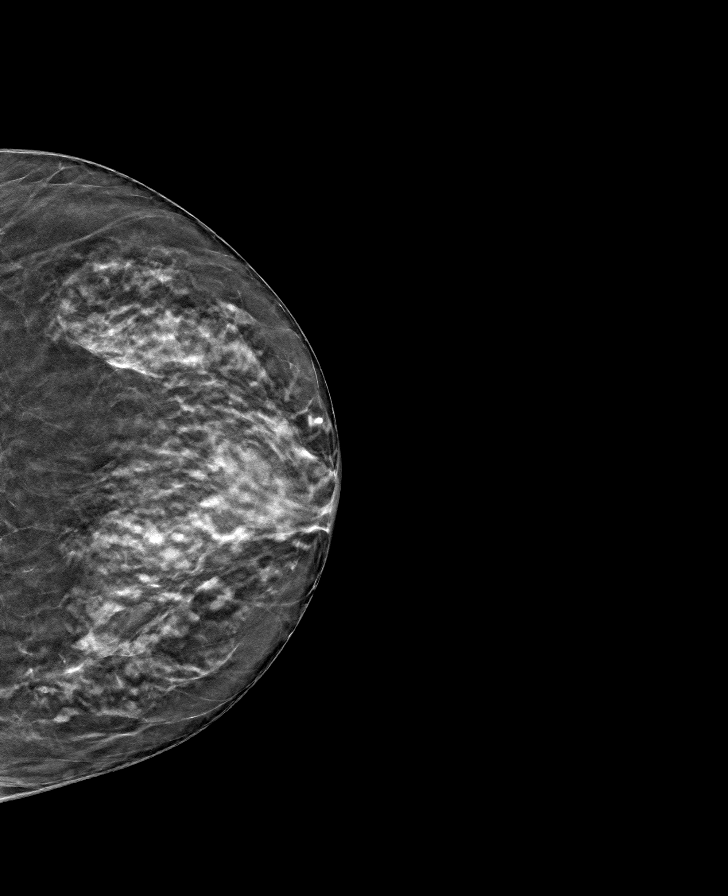

[L MLO tomo · tomo slice 31/60.0]
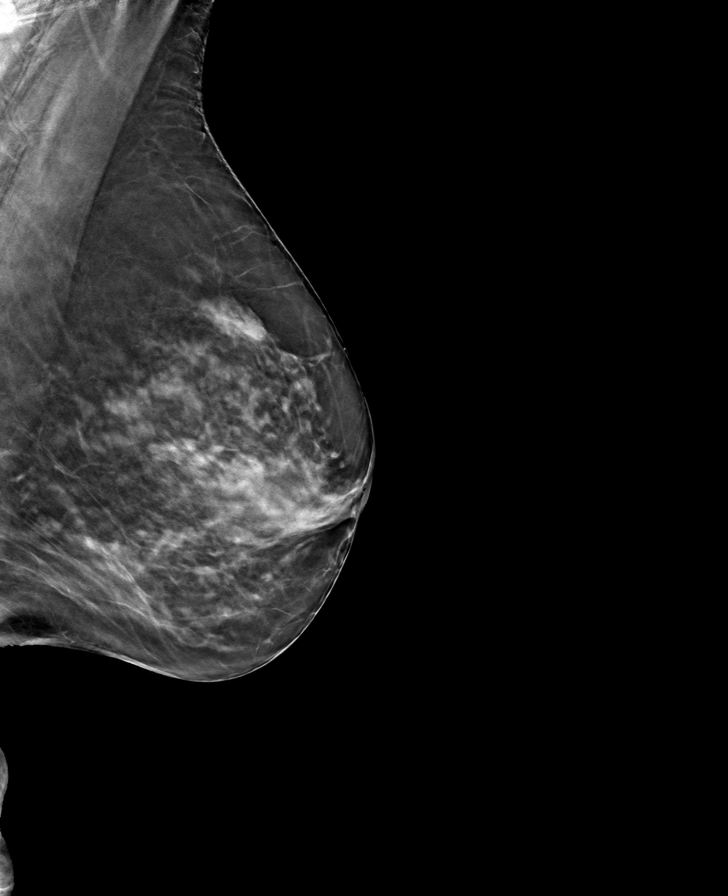

[8 of 24 positions shown; findings below may reference images not displayed]

ACR Breast Density Category c: The breast tissue is heterogeneously
dense, which may obscure small masses.
FINDINGS: There are no findings suspicious for malignancy.
IMPRESSION: No mammographic evidence of malignancy. A result letter of this
screening mammogram will be mailed directly to the patient.

RECOMMENDATION:
Screening mammogram in one year. (Code:Q3-W-BC3)

BI-RADS CATEGORY  1: Negative.

## 2023-08-16 ENCOUNTER — Other Ambulatory Visit: Payer: Self-pay | Admitting: Internal Medicine

## 2023-08-16 DIAGNOSIS — Z1231 Encounter for screening mammogram for malignant neoplasm of breast: Secondary | ICD-10-CM

## 2023-09-01 ENCOUNTER — Ambulatory Visit
Admission: RE | Admit: 2023-09-01 | Discharge: 2023-09-01 | Disposition: A | Discharge status: Home / Self Care | Source: Ambulatory Visit | Attending: Internal Medicine | Admitting: Internal Medicine

## 2023-09-01 DIAGNOSIS — Z1231 Encounter for screening mammogram for malignant neoplasm of breast: Secondary | ICD-10-CM | POA: Diagnosis present
